# Patient Record
Sex: Female | Born: 1940 | Race: White | Hispanic: No | Marital: Married | Smoking: Former smoker
Health system: Southern US, Community
[De-identification: ages and names within clinical notes are randomized; demographics above are authoritative.]

## PROBLEM LIST (undated history)

## (undated) DIAGNOSIS — K2970 Gastritis, unspecified, without bleeding: Secondary | ICD-10-CM

## (undated) DIAGNOSIS — I10 Essential (primary) hypertension: Secondary | ICD-10-CM

## (undated) DIAGNOSIS — K2100 Gastro-esophageal reflux disease with esophagitis, without bleeding: Secondary | ICD-10-CM

## (undated) DIAGNOSIS — S92901A Unspecified fracture of right foot, initial encounter for closed fracture: Secondary | ICD-10-CM

## (undated) DIAGNOSIS — K219 Gastro-esophageal reflux disease without esophagitis: Secondary | ICD-10-CM

## (undated) DIAGNOSIS — G709 Myoneural disorder, unspecified: Secondary | ICD-10-CM

## (undated) DIAGNOSIS — G3184 Mild cognitive impairment, so stated: Secondary | ICD-10-CM

## (undated) DIAGNOSIS — G039 Meningitis, unspecified: Secondary | ICD-10-CM

## (undated) DIAGNOSIS — E785 Hyperlipidemia, unspecified: Secondary | ICD-10-CM

## (undated) DIAGNOSIS — K224 Dyskinesia of esophagus: Secondary | ICD-10-CM

## (undated) DIAGNOSIS — D1803 Hemangioma of intra-abdominal structures: Secondary | ICD-10-CM

## (undated) DIAGNOSIS — M1990 Unspecified osteoarthritis, unspecified site: Secondary | ICD-10-CM

## (undated) HISTORY — PX: ESOPHAGOGASTRODUODENOSCOPY: SHX1529

## (undated) HISTORY — PX: LUMBAR LAMINECTOMY: SHX95

## (undated) HISTORY — PX: COLONOSCOPY: SHX174

## (undated) HISTORY — PX: CATARACT EXTRACTION: SUR2

## (undated) HISTORY — PX: TUBAL LIGATION: SHX77

## (undated) HISTORY — PX: DENTAL SURGERY: SHX609

---

## 2003-04-21 ENCOUNTER — Inpatient Hospital Stay (HOSPITAL_COMMUNITY): Admission: RE | Admit: 2003-04-21 | Discharge: 2003-04-24 | Payer: Self-pay | Admitting: Neurological Surgery

## 2003-06-01 ENCOUNTER — Encounter: Admission: RE | Admit: 2003-06-01 | Discharge: 2003-06-01 | Payer: Self-pay | Admitting: Neurological Surgery

## 2003-06-10 ENCOUNTER — Encounter: Admission: RE | Admit: 2003-06-10 | Discharge: 2003-06-10 | Payer: Self-pay | Admitting: Neurological Surgery

## 2003-08-10 ENCOUNTER — Encounter: Admission: RE | Admit: 2003-08-10 | Discharge: 2003-08-10 | Payer: Self-pay | Admitting: Neurological Surgery

## 2003-09-23 ENCOUNTER — Encounter: Admission: RE | Admit: 2003-09-23 | Discharge: 2003-09-23 | Payer: Self-pay | Admitting: Neurological Surgery

## 2003-11-12 ENCOUNTER — Encounter: Admission: RE | Admit: 2003-11-12 | Discharge: 2003-11-12 | Payer: Self-pay | Admitting: Obstetrics

## 2004-04-24 ENCOUNTER — Encounter: Admission: RE | Admit: 2004-04-24 | Discharge: 2004-04-24 | Payer: Self-pay | Admitting: Neurological Surgery

## 2005-02-13 ENCOUNTER — Ambulatory Visit: Payer: Self-pay | Admitting: Internal Medicine

## 2005-05-03 ENCOUNTER — Ambulatory Visit (HOSPITAL_BASED_OUTPATIENT_CLINIC_OR_DEPARTMENT_OTHER): Admission: RE | Admit: 2005-05-03 | Discharge: 2005-05-03 | Payer: Self-pay | Admitting: Orthopedic Surgery

## 2005-07-06 ENCOUNTER — Ambulatory Visit: Payer: Self-pay | Admitting: Internal Medicine

## 2005-08-01 ENCOUNTER — Ambulatory Visit: Payer: Self-pay | Admitting: Gastroenterology

## 2005-08-06 ENCOUNTER — Ambulatory Visit: Payer: Self-pay | Admitting: Gastroenterology

## 2005-08-08 ENCOUNTER — Ambulatory Visit: Payer: Self-pay | Admitting: Gastroenterology

## 2005-12-11 ENCOUNTER — Ambulatory Visit: Payer: Self-pay | Admitting: Internal Medicine

## 2006-04-23 ENCOUNTER — Ambulatory Visit: Payer: Self-pay | Admitting: Internal Medicine

## 2006-09-10 ENCOUNTER — Other Ambulatory Visit: Payer: Self-pay

## 2006-09-10 ENCOUNTER — Ambulatory Visit: Payer: Self-pay | Admitting: Gastroenterology

## 2006-09-17 ENCOUNTER — Ambulatory Visit: Payer: Self-pay | Admitting: Gastroenterology

## 2007-11-06 IMAGING — US ABDOMEN ULTRASOUND
1 series · 17 of 25 positions shown · non-contrast
Comparison: none

REASON FOR EXAM: Nausea Vomiting RUQ
COMMENTS:

[Series 1: abdomen ultrasound · 17 of 62 slices shown]
[im 1/62]
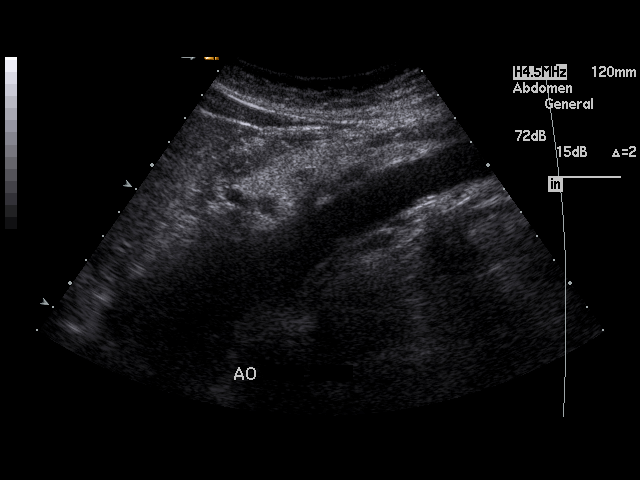
[im 6/62]
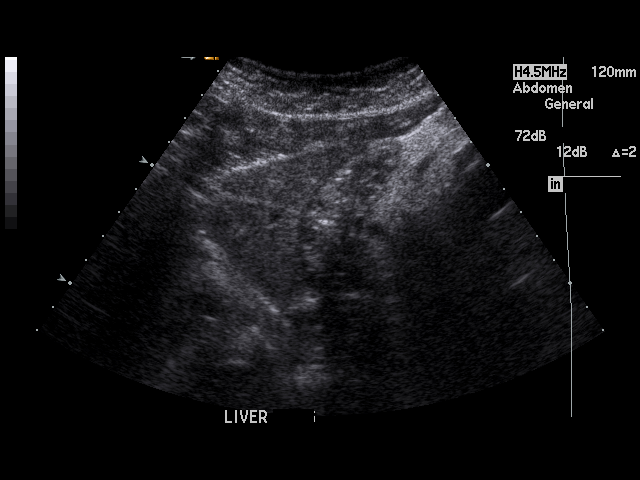
[im 8/62]
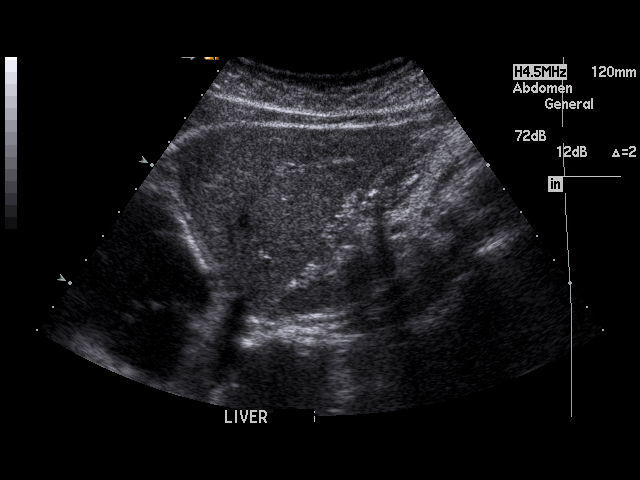
[im 13/62]
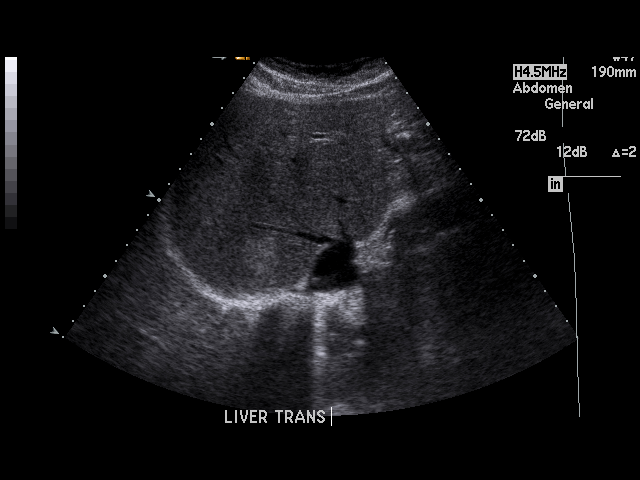
[im 16/62]
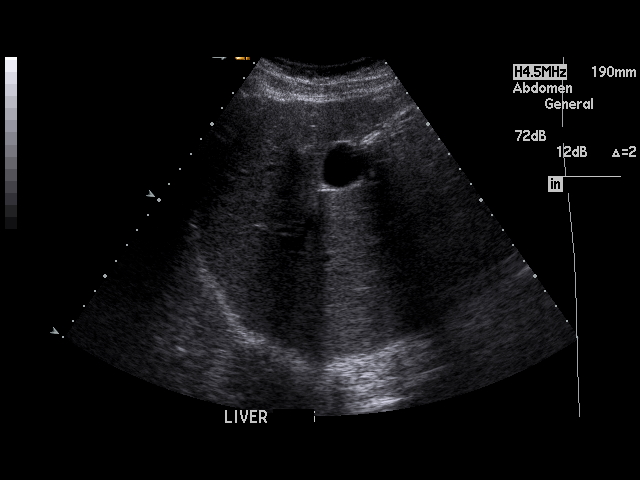
[im 21/62]
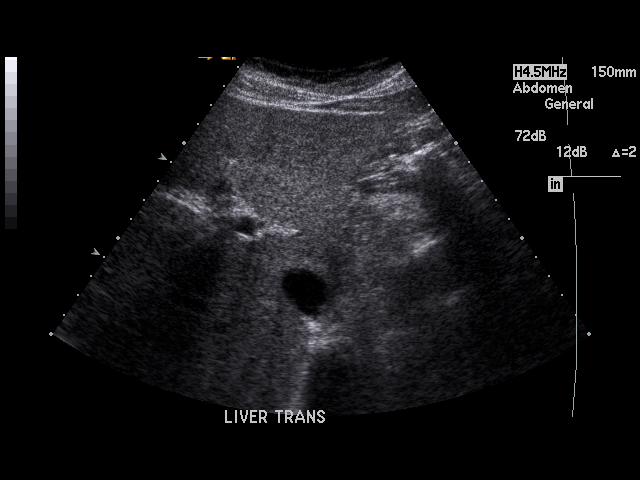
[im 23/62]
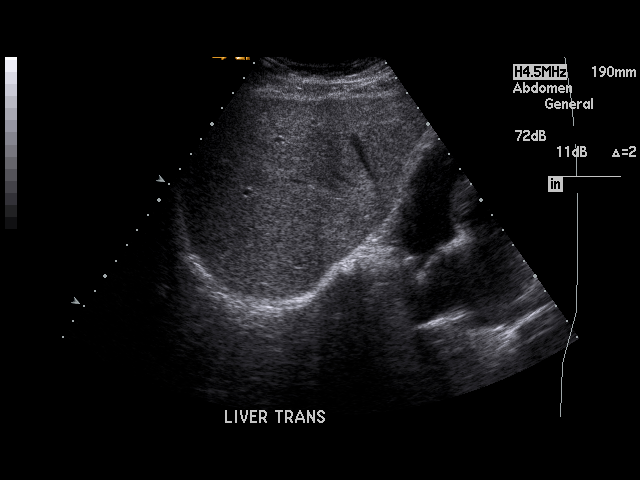
[im 28/62]
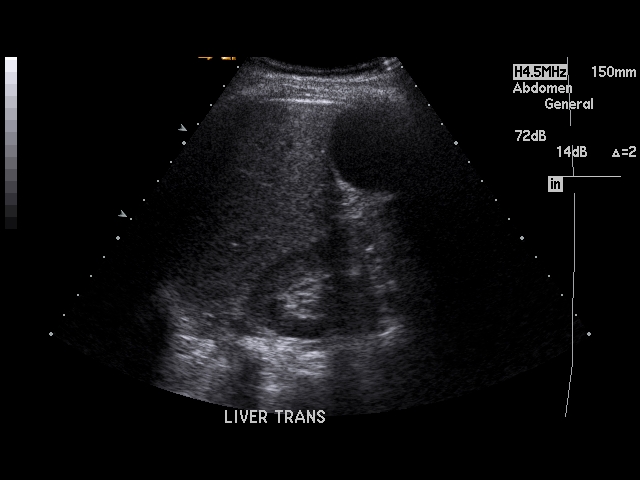
[im 31/62]
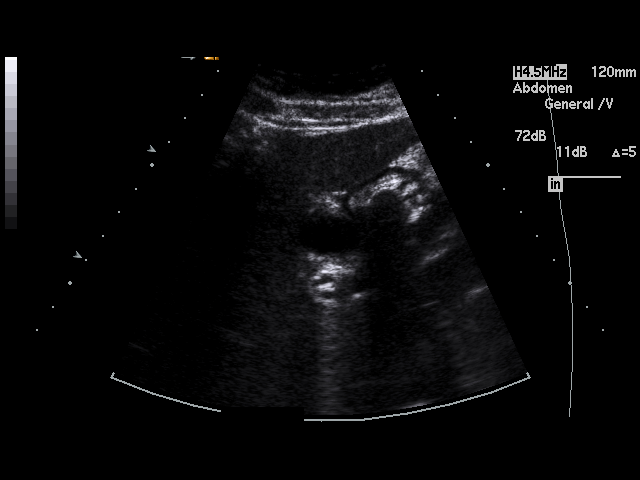
[im 34/62]
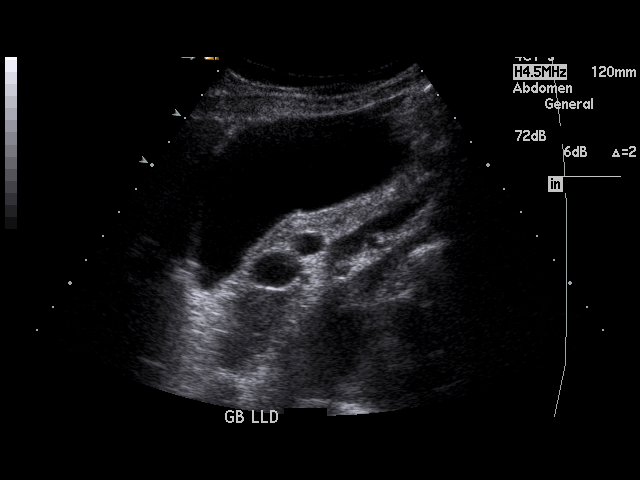
[im 39/62]
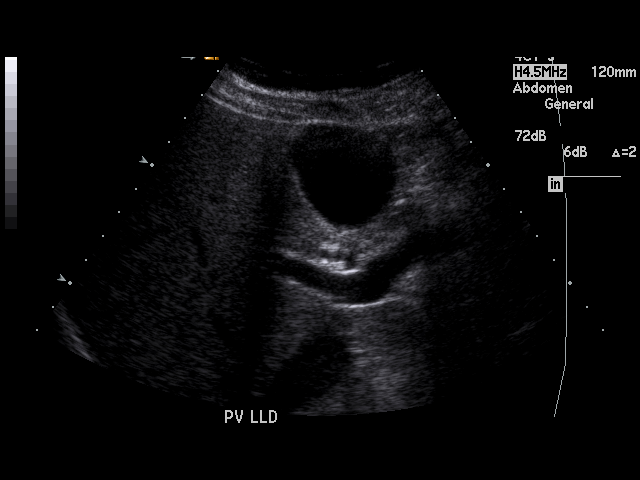
[im 41/62]
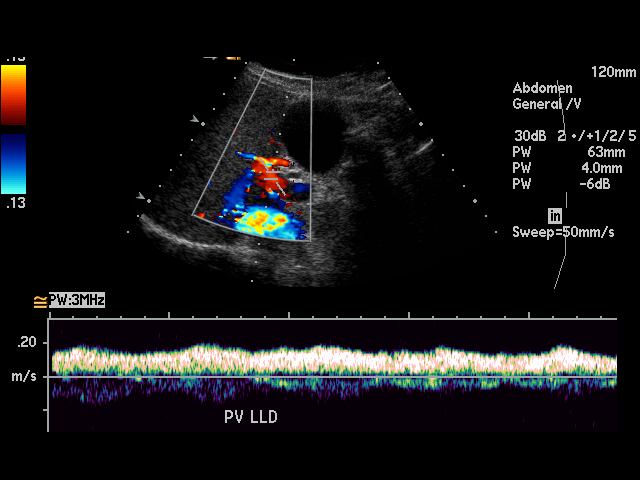
[im 46/62]
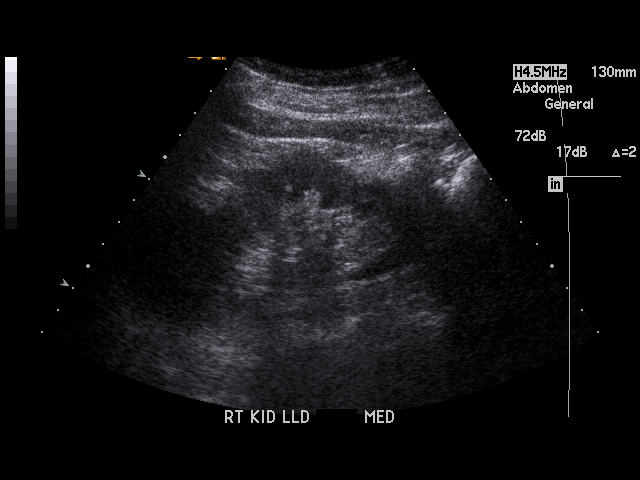
[im 49/62]
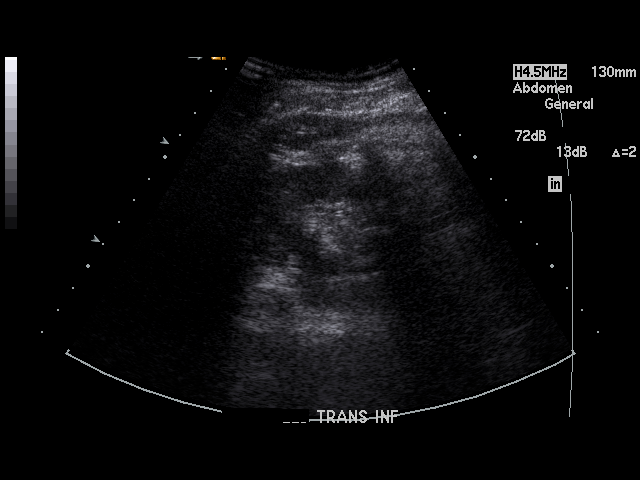
[im 54/62]
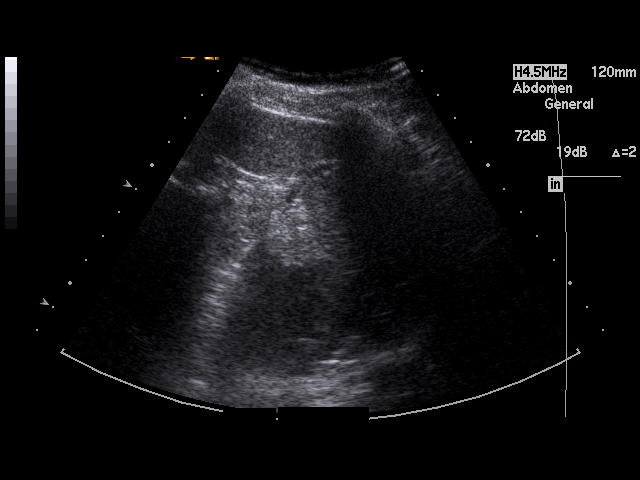
[im 56/62]
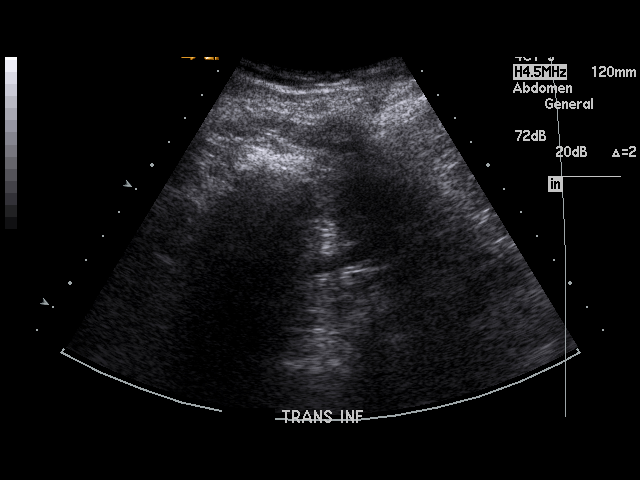
[im 62/62]
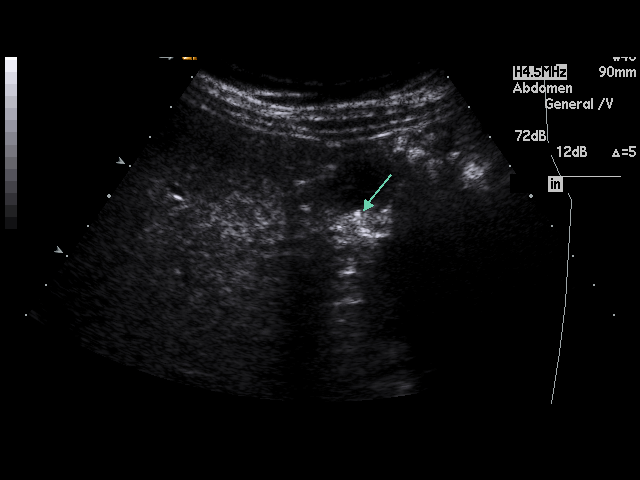

[17 of 25 positions shown; findings below may reference images not displayed]

PROCEDURE:     US  - US ABDOMEN GENERAL SURVEY  - July 06, 2005  [DATE]

RESULT:     There is a 1.9 cm hyperechoic focus posteriorly in the RIGHT
lobe of the liver.  In a patient with no known primary this likely
represents a hepatic hemangioma.  Note is made that a possible hepatic
hemangioma was mentioned in the report of a prior CT from 8111.  The hepatic
echo pattern otherwise is normal in appearance. Spleen size is normal. The
pancreas is normal in appearance. There is a tiny mobile echodensity in the
gallbladder suspicious for a tiny gallstone.  There is no thickening of the
gallbladder wall. The common bile duct measures 7.4 mm in diameter, which is
upper limits to normal.  If clinically indicated, biliary nuclide scan might
be helpful to further evaluate for patency of the common duct.  The kidneys
show no hydronephrosis. There is no ascites. The abdominal aorta is normal
in appearance.
IMPRESSION: 1)There is a tiny echodensity in the gallbladder compatible with a small
mobile gallstone.

2)No thickening of the gallbladder wall is seen.

3)The common bile duct measures 7.4 mm in diameter, which is upper limits to
normal.

4)There is a focal hyperechoic area in the RIGHT lobe of the liver
consistent with a hepatic hemangioma.  Since the prior CT is not available
to document that the possible hemangioma noted at CT corresponds in location
to the current finding, a follow-up ultrasound examination of the liver in
six months is recommended to document stability.

## 2007-12-11 ENCOUNTER — Ambulatory Visit: Payer: Self-pay | Admitting: Internal Medicine

## 2008-05-11 ENCOUNTER — Ambulatory Visit (HOSPITAL_COMMUNITY): Admission: RE | Admit: 2008-05-11 | Discharge: 2008-05-11 | Payer: Self-pay | Admitting: Anesthesiology

## 2008-09-27 ENCOUNTER — Ambulatory Visit: Payer: Self-pay | Admitting: Gastroenterology

## 2008-10-12 ENCOUNTER — Ambulatory Visit: Payer: Self-pay | Admitting: Gastroenterology

## 2008-12-28 ENCOUNTER — Ambulatory Visit: Payer: Self-pay | Admitting: Gastroenterology

## 2008-12-29 ENCOUNTER — Ambulatory Visit: Payer: Self-pay | Admitting: Gastroenterology

## 2009-01-24 ENCOUNTER — Ambulatory Visit: Payer: Self-pay | Admitting: Internal Medicine

## 2009-10-21 ENCOUNTER — Ambulatory Visit (HOSPITAL_BASED_OUTPATIENT_CLINIC_OR_DEPARTMENT_OTHER): Admission: RE | Admit: 2009-10-21 | Discharge: 2009-10-21 | Payer: Self-pay | Admitting: Orthopedic Surgery

## 2010-01-26 ENCOUNTER — Ambulatory Visit: Payer: Self-pay | Admitting: Internal Medicine

## 2010-08-25 NOTE — Discharge Summary (Signed)
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   PROCEDURE:  Posterior lumbar interbody fusion at L4-5.   HISTORY OF PRESENT ILLNESS:  The patient is a 70 year old white female who  had undergone a previous lumbar decompression and noninstrumented fusion by  Dr. Gerrit Heck several months ago.  She continued to have back pain and right  leg pain.  She had plain films which showed increasing spondylolisthesis at  L4-5.  She had MRI and a CT scan which showed a Pseudoarthrosis at L4-5 with  worsening spondylolisthesis.  I recommended lumbar exploration, repeat  decompression, and instrumented fusion at L4-5.  She understood the risks,  benefits, and alternatives and wished to proceed.   HOSPITAL COURSE:  The patient was admitted on April 21, 2003, where she  underwent a lumbar reexploration, repeat decompression, and instrumented  fusion at L4-5. The patient tolerated the procedure well, was taken to the  recovery room and then to the floor in stable condition.  For details of the  operative procedure, please see the dictated operative note.  The patient's  hospital course was routine, there were no complications.  She slowly  mobilized to the point that she was ambulating in the halls with minimal  difficulty.  She had appropriate incisional pain. She had a little bit of  right posterior thigh pain. She was tolerating a regular diet, she was  afebrile, and her vital signs remained stable.  Her CBC's looked good  without significant blood loss  anemia.  She was discharged home in stable  condition on April 24, 2003.   DISCHARGE MEDICATIONS:  Percocet and Flexeril.   FOLLOW UP:  In two weeks with Dr. Yetta Barre.   FINAL DIAGNOSES:  Pseudoarthrosis, status post instrumented posterior lumbar  interbody fusion at L4-5.                                                Tia Alert, MD    DSJ/MEDQ  D:  05/07/2003  T:  05/08/2003  Job:  [id number]

## 2010-08-25 NOTE — Op Note (Signed)
Erika Phillips, Erika Phillips                             ACCOUNT NO.:  [id number]   MEDICAL RECORD NO.:  [id number]                   PATIENT TYPE:  INP   LOCATION:  3015                                 FACILITY:  [facility]   PHYSICIAN:  Tia Alert, MD                  DATE OF BIRTH:  11/21/1940   DATE OF PROCEDURE:  04/21/2003  DATE OF DISCHARGE:                                 OPERATIVE REPORT   PREOPERATIVE DIAGNOSES:  1. 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  2. Grade 1 spondylolisthesis, L4-5 with central canal and lateral recess     stenosis.  3. Pseudoarthrosis L4-5.   POSTOPERATIVE DIAGNOSES:  1. 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  2. Grade 1 spondylolisthesis, L4-5 with central canal and lateral recess     stenosis.  3. Pseudoarthrosis L4-5.   OPERATION PERFORMED:  1. Exploration of fusion L4-5.  2. Repeat decompressive laminectomy, hemifacetectomy and foraminotomy L4-5     for nerve root and central canal decompression.  3. Posterior lumbar interbody fusion, L4-5 utilizing 12 mm tangent allograft     bone wedges, bone morphogenic protein and locally harvested morselized     autologous bone graft.  4. Intertransverse arthrodesis utilizing bone morphogenic protein and local     autograft.  5. Nonsegmental fixation, L4-5.   SURGEON:  Tia Alert, MD   ASSISTANT:  Donalee Citrin, M.D.   ANESTHESIA:  General endotracheal.   COMPLICATIONS:  None.   ESTIMATED BLOOD LOSS:  1200 mL.  700 mL given back as Cell Saver.   FINDINGS:  This patient had a pseudoarthrosis at L4-5 where she had  undergone a previous Gill decompression and noninstrumented fusion by  another Careers adviser several months ago.  We found significant scar tissue making  the decompression much more difficult to perform.  We also found that she  had a pseudoarthrosis with no significant bony fusion of the onlay autograft  that she had before and she had significant  segmental instability at L4-5.  Again, the significant scar tissue made it very difficult to decompress the  L4 and L5 nerve roots to try to treat her radicular pain.   INDICATIONS FOR PROCEDURE:  Erika Phillips is a 70 year old white female who had  undergone a previous Gill decompression and noninstrumented fusion by  another Careers adviser.  She had continued back pain with right leg pain.  She had  an MRI and then a CT myelogram which showed lateral recess stenosis with  glacial instability at L4-5 with progressive slip at L4-5 on serial x-rays.  She had tried medical management for quite some time without significant  relief.  I recommended exploration of her fusion with repeat decompression  and instrumented fusion at L4-5 to treat her pseudoarthrosis and her  spondylolisthesis.  She understood the risks, benefits and alternatives and  wished to proceed.   DESCRIPTION OF PROCEDURE:  The patient was taken to the operating room and  after induction of adequate general endotracheal anesthesia, she was rolled  in prone position on the Wilson frame and all pressure points were padded.  Her lumbar region was prepped with DuraPrep and draped in the usual sterile  fashion.  10 mL of local anesthesia was injected and then a dorsal midline  incision was made and carried down to the lumbosacral fascia which was  opened and the paraspinous musculature was taken down in subperiosteal  fashion to expose the L3 and L5 spinous processes.  The L4 spinous process  was gone.  We dissected down over the facets at L4-5.  Because of the  significant scarring and the previous attempted posterolateral fusion, it  was a very difficult dissection and took quite some time and took great care  to avoid damage of the nerve roots and to avoid dural laceration, we were  very careful to dissect the scar away from the bony edges.  We were then  able to remove the inferior facets of L4 and perform a hemifacetectomy and   complete the laminectomy and then perform wide laminotomies at L4-5  bilaterally.  We were able to identify the L4 and L5 nerve roots which were  encased in scar tissue.  We took great care to decompress these and to  detether these from the scar tissue.  Once the decompression was complete,  we turned our attention to the posterior lumbar interbody fusion.  We  coagulated the epidural venous vasculature and cut the disk space  bilaterally and performed initial diskectomy with pituitary rongeurs.  We  then distracted the disk space up to 12 mm with sequential distraction plugs  and then on the patient's right side we used the rotating cutter, the round  scraper and then the 12 mm cutting chisel to prepare the end plates.  We  then placed a 12 mm tangent allograft bone wedge into the interspace at L4-  5.  We then on the other side prepared the interspace the same way with the  rotating cutter, round scraper and cutting chisel.  We then packed the  midline with local autograft mixed with a BMP sponge for arthrodesis and  then placed another 12 mm tangent allograft bone wedge at the interspace on  the left side. Once the PLIF was complete, we turned our attention to the  nonsegmental fixation with localized pedicle screw entry zones and under  fluoroscopic guidance, placed pedicle screws into the pedicles of L4 and L5.  We probed each pedicle, tapped each pedicle with a 5.5 tap, then palpated  each pedicle to assure no break in the cortex and then placed 6.5 x 40 mm  pedicle screws into the pedicles of L4 and L5 bilaterally.  We then  decorticated the facets and transverse processes and placed a combination of  local autograft with BMP out over these to perform a lateral arthrodesis  also.  We then placed two 40 mm rods into the multi_________  screw heads  and locked these into position with locking caps with an antitorque device. We did use the compression tools to compress these prior to  final locking.  We then placed a separate cross link.  We then lined the dura with Gelfoam  and then dried all bleeding points.  We then placed a medium Hemovac drain.  We then closed the muscle and then the fascia with interrupted #  1 Vicryl.  We closed the subcutaneous tissue with 2-0 and 3-0 Vicryl and closed the  skin with benzoin and Steri-Strips.  Drapes were removed and sterile  dressing was applied.  The patient was awakened from general anesthesia and  transported  to the recovery room in stable condition.  At the end of the  procedure all sponge, needle and instrument counts were correct.                                               Tia Alert, MD    DSJ/MEDQ  D:  04/21/2003  T:  04/22/2003  Job:  [id number]

## 2010-08-25 NOTE — Op Note (Signed)
NAMENARY, SNEED                 ACCOUNT NO.:  [id number]   MEDICAL RECORD NO.:  [id number]          PATIENT TYPE:  AMB   LOCATION:  DSC                          FACILITY:  [facility]   PHYSICIAN:  Katy Fitch. Sypher, M.D. 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   OPERATION:  1.  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  2.  Release of left long finger A1 pulley.   SURGEON:  Katy Fitch. Sypher, M.D.   ASSISTANT:  Marveen Reeks. Dasnoit, P.A.-C.   ANESTHESIA:  0.25% Marcaine and 2% lidocaine metacarpal head level block,  left long finger supplemented by IV sedation.   SUPERVISING ANESTHESIOLOGIST:  Janetta Hora. Gelene Mink, M.D.   INDICATIONS:  Erika Phillips is a 70 year old woman who is employed at the  [employer].   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   In addition she was developing early Dupuytren's disease in the pre  tendinous fibers to a long finger, causing a nodule and cord in her palm.   She is brought to the operating at this time for release of her left long  finger A1 pulley. We will need to perform incidental Dupuytren's release to  expose the pulley for safe release.   DESCRIPTION OF PROCEDURE:  Vesna Kable was brought to the operating room and  placed supine  position on the table.   Following light sedation the left arm was prepped with Betadine soap and  solution and sterilely draped.  A pneumatic tourniquet was applied to the  proximal left brachium.   0.25% Marcaine and 2% lidocaine were infiltrated into the path of the  intended incision and around the common digital nerves serving the long  finger.   When anesthesia was satisfactory, the arm was exsanguinated by elevation and  compression and an arterial tourniquet on the proximal brachium inflated to  220 mmHg.   The procedure commenced with a oblique incision in the line of the distal  palmar crease. The subcutaneous tissues were divided taking care to separate  the Dupuytren's nodules from the deep surface of the dermis over a distance  of 2.5 cm.   These were circumferentially dissected and removed.   This released the tension in the pretendinous fibers to the long finger.   The A1 pulley was then isolated.  Blunt retractors placed to protect the  neurovascular structures followed by release of the radial aspect of the  A1  pulley.   Thereafter, free range of motion of the long finger was recovered with full  flexion and extension without residual crepitation.   The wound was then checked for bleeding points, repaired with mattress  sutures of 5-0 nylon.   A compressive dressing was applied with Xeroflo, sterile gauze and Ace wrap.   For aftercare, Ms. Rozzell is provided a prescription for Vicodin 5 mg one  p.o. q.4-6h. p.r.n. pain, #20 tablets without refill.   She will return to our office for followup in a week for suture removal and  advancement to a therapy program.      Katy Fitch. Sypher, M.D.  Electronically Signed     RVS/MEDQ  D:  05/03/2005  T:  05/04/2005  Job:  [id number]   cc:   Loraine Leriche L. Vear Clock, M.D.  Fax: [phone]

## 2011-02-08 ENCOUNTER — Ambulatory Visit: Payer: Self-pay | Admitting: Internal Medicine

## 2011-03-16 ENCOUNTER — Ambulatory Visit: Payer: Self-pay | Admitting: Gastroenterology

## 2011-07-24 ENCOUNTER — Ambulatory Visit: Payer: Self-pay | Admitting: Gastroenterology

## 2012-02-12 ENCOUNTER — Ambulatory Visit: Payer: Self-pay | Admitting: Internal Medicine

## 2013-03-30 ENCOUNTER — Ambulatory Visit: Payer: Self-pay | Admitting: Internal Medicine

## 2013-09-23 ENCOUNTER — Ambulatory Visit: Payer: Self-pay | Admitting: Ophthalmology

## 2013-10-28 ENCOUNTER — Ambulatory Visit: Payer: Self-pay | Admitting: Ophthalmology

## 2013-11-09 ENCOUNTER — Ambulatory Visit: Payer: Self-pay | Admitting: Gastroenterology

## 2013-11-17 ENCOUNTER — Ambulatory Visit: Payer: Self-pay | Admitting: Gastroenterology

## 2013-11-19 LAB — PATHOLOGY REPORT

## 2014-04-06 ENCOUNTER — Ambulatory Visit: Payer: Self-pay | Admitting: Internal Medicine

## 2015-03-02 ENCOUNTER — Other Ambulatory Visit: Payer: Self-pay | Admitting: Anesthesiology

## 2015-03-02 DIAGNOSIS — M545 Low back pain: Secondary | ICD-10-CM

## 2015-03-22 ENCOUNTER — Ambulatory Visit
Admission: RE | Admit: 2015-03-22 | Discharge: 2015-03-22 | Disposition: A | Discharge status: Home / Self Care | Payer: Medicare Other | Source: Ambulatory Visit | Attending: Anesthesiology | Admitting: Anesthesiology

## 2015-03-22 DIAGNOSIS — M545 Low back pain: Secondary | ICD-10-CM

## 2015-03-22 MED ORDER — GADOBENATE DIMEGLUMINE 529 MG/ML IV SOLN: 12.0000 mL | Freq: Once | INTRAVENOUS | Status: DC | PRN

## 2015-05-18 ENCOUNTER — Other Ambulatory Visit: Payer: Self-pay | Admitting: Internal Medicine

## 2015-05-18 DIAGNOSIS — Z1231 Encounter for screening mammogram for malignant neoplasm of breast: Secondary | ICD-10-CM

## 2015-05-26 ENCOUNTER — Ambulatory Visit: Payer: Medicare Other

## 2015-05-26 ENCOUNTER — Ambulatory Visit
Admission: RE | Admit: 2015-05-26 | Discharge: 2015-05-26 | Disposition: A | Discharge status: Home / Self Care | Payer: Medicare Other | Source: Ambulatory Visit | Attending: Internal Medicine | Admitting: Internal Medicine

## 2015-05-26 DIAGNOSIS — Z1231 Encounter for screening mammogram for malignant neoplasm of breast: Secondary | ICD-10-CM | POA: Insufficient documentation

## 2015-05-30 ENCOUNTER — Ambulatory Visit
Admission: RE | Admit: 2015-05-30 | Discharge: 2015-05-30 | Disposition: A | Discharge status: Home / Self Care | Payer: Medicare Other | Source: Ambulatory Visit | Attending: Anesthesiology | Admitting: Anesthesiology

## 2015-05-30 ENCOUNTER — Other Ambulatory Visit: Payer: Self-pay | Admitting: Anesthesiology

## 2015-05-30 DIAGNOSIS — Z9689 Presence of other specified functional implants: Secondary | ICD-10-CM

## 2015-05-30 DIAGNOSIS — Z9889 Other specified postprocedural states: Principal | ICD-10-CM

## 2015-08-16 ENCOUNTER — Other Ambulatory Visit: Payer: Self-pay | Admitting: Neurosurgery

## 2015-08-31 ENCOUNTER — Encounter (HOSPITAL_COMMUNITY)
Admission: RE | Admit: 2015-08-31 | Discharge: 2015-08-31 | Disposition: A | Discharge status: Home / Self Care | Payer: Medicare Other | Source: Ambulatory Visit | Attending: Neurosurgery | Admitting: Neurosurgery

## 2015-08-31 ENCOUNTER — Other Ambulatory Visit: Payer: Self-pay

## 2015-08-31 ENCOUNTER — Encounter (HOSPITAL_COMMUNITY): Payer: Self-pay

## 2015-08-31 DIAGNOSIS — M961 Postlaminectomy syndrome, not elsewhere classified: Secondary | ICD-10-CM | POA: Insufficient documentation

## 2015-08-31 DIAGNOSIS — Z01818 Encounter for other preprocedural examination: Secondary | ICD-10-CM | POA: Diagnosis present

## 2015-08-31 DIAGNOSIS — Z01812 Encounter for preprocedural laboratory examination: Secondary | ICD-10-CM | POA: Insufficient documentation

## 2015-08-31 HISTORY — DX: Gastro-esophageal reflux disease with esophagitis, without bleeding: K21.00

## 2015-08-31 HISTORY — DX: Hemangioma of intra-abdominal structures: D18.03

## 2015-08-31 HISTORY — DX: Unspecified fracture of right foot, initial encounter for closed fracture: S92.901A

## 2015-08-31 HISTORY — DX: Unspecified osteoarthritis, unspecified site: M19.90

## 2015-08-31 HISTORY — DX: Gastritis, unspecified, without bleeding: K29.70

## 2015-08-31 HISTORY — DX: Essential (primary) hypertension: I10

## 2015-08-31 HISTORY — DX: Gastro-esophageal reflux disease with esophagitis: K21.0

## 2015-08-31 HISTORY — DX: Myoneural disorder, unspecified: G70.9

## 2015-08-31 HISTORY — DX: Gastro-esophageal reflux disease without esophagitis: K21.9

## 2015-08-31 HISTORY — DX: Meningitis, unspecified: G03.9

## 2015-08-31 HISTORY — DX: Hyperlipidemia, unspecified: E78.5

## 2015-08-31 LAB — SURGICAL PCR SCREEN
MRSA, PCR: NEGATIVE
Staphylococcus aureus: NEGATIVE

## 2015-08-31 LAB — BASIC METABOLIC PANEL
ANION GAP: 8 (ref 5–15)
BUN: 16 mg/dL (ref 6–20)
CALCIUM: 9.3 mg/dL (ref 8.9–10.3)
CO2: 27 mmol/L (ref 22–32)
CREATININE: 1.01 mg/dL — AB (ref 0.44–1.00)
Chloride: 100 mmol/L — ABNORMAL LOW (ref 101–111)
GFR calc Af Amer: >60 mL/min (ref >60)
GFR calc non Af Amer: 54 mL/min — ABNORMAL LOW (ref >60)
GLUCOSE: 93 mg/dL (ref 65–99)
Potassium: 3.8 mmol/L (ref 3.5–5.1)
Sodium: 135 mmol/L (ref 135–145)

## 2015-08-31 LAB — CBC
HEMATOCRIT: 36.8 % (ref 36.0–46.0)
Hemoglobin: 11.7 g/dL — ABNORMAL LOW (ref 12.0–15.0)
MCH: 29.3 pg (ref 26.0–34.0)
MCHC: 31.8 g/dL (ref 30.0–36.0)
MCV: 92 fL (ref 78.0–100.0)
PLATELETS: 199 10*3/uL (ref 150–400)
RBC: 4 MIL/uL (ref 3.87–5.11)
RDW: 13 % (ref 11.5–15.5)
WBC: 5.9 10*3/uL (ref 4.0–10.5)

## 2015-08-31 NOTE — Progress Notes (Signed)
PCP - Ramonita Lab, MD Cardiologist - denies  Chest x-ray - denies EKG - 08/31/15 Stress Test - denies ECHO - denies Cardiac Cath - denies    Patient denies shortness of breath and chest pain at PAT appointment

## 2015-08-31 NOTE — Pre-Procedure Instructions (Signed)
Vivie A Stocks  08/31/2015     No Pharmacies Listed   Your procedure is scheduled on Friday, June 2nd, 2017.  Report to [facility] Admitting at 10:30 A.M.   Call this number if you have problems the morning of surgery:  [phone]   Remember:  Do not eat food or drink liquids after midnight.   Take these medicines the morning of surgery with A SIP OF WATER: Dexilant, Hydrocodone-acetaminophen (Norco) if needed, Methadone (Dolophine) if needed, Sucralfate (Carafate).   7 days prior to surgery STOP taking any Aspirin, Aleve, Naproxen, Ibuprofen, Motrin, Advil, Goody's, BC's, all herbal medications, fish oil, and all vitamins    Do not wear jewelry, make-up or nail polish.  Do not wear lotions, powders, or perfumes.  You may NOT wear deodorant.  Do not shave 48 hours prior to surgery.    Do not bring valuables to the hospital.   [facility] is not responsible for any belongings or valuables.  Contacts, dentures or bridgework may not be worn into surgery.  Leave your suitcase in the car.  After surgery it may be brought to your room.  For patients admitted to the hospital, discharge time will be determined by your treatment team.  Patients discharged the day of surgery will not be allowed to drive home.   Special instructions:  See attached.   Please read over the following fact sheets that you were given. Pain Booklet, Coughing and Deep Breathing, MRSA Information and Surgical Site Infection Prevention     [place]- Preparing For Surgery  Before surgery, you can play an important role. Because skin is not sterile, your skin needs to be as free of germs as possible. You can reduce the number of germs on your skin by washing with CHG (chlorahexidine gluconate) Soap before surgery.  CHG is an antiseptic cleaner which kills germs and bonds with the skin to continue killing germs even after washing.  Please do not use if you have an allergy to CHG or  antibacterial soaps. If your skin becomes reddened/irritated stop using the CHG.  Do not shave (including legs and underarms) for at least 48 hours prior to first CHG shower. It is OK to shave your face.  Please follow these instructions carefully.   1. Shower the night before surgery and the morning of surgery with CHG.   2. If you chose to wash your hair, wash your hair first as usual with your normal shampoo.  3. After you shampoo, rinse your hair and body thoroughly to remove the shampoo.  4. Use CHG as you would any other liquid soap. You can apply CHG directly to the skin and wash gently with a scrungie or a clean washcloth.   5. Apply the CHG Soap to your body ONLY FROM THE NECK DOWN.  Do not use on open wounds or open sores. Avoid contact with your eyes, ears, mouth and genitals (private parts). Wash genitals (private parts) with your normal soap.  6. Wash thoroughly, paying special attention to the area where your surgery will be performed.  7. Thoroughly rinse your body with warm water from the neck down.  8. DO NOT shower/wash with your normal soap after using and rinsing off the CHG Soap.  9. Pat yourself dry with a clean towel.   10. Wear clean pajamas   11. Place clean sheets on your bed the night of your first shower and do not sleep with pets.  Day of Surgery: Do not apply any deodorants/lotions. Please wear clean clothes to the hospital/surgery center.

## 2015-09-08 MED ORDER — CEFAZOLIN SODIUM-DEXTROSE 2-4 GM/100ML-% IV SOLN
2.0000 g | INTRAVENOUS | Status: AC
  Administered 2015-09-09: 2 g via INTRAVENOUS
  Filled 2015-09-08: qty 100

## 2015-09-08 NOTE — Progress Notes (Signed)
I spoke with Erika Phillips and confirned arrival time of 66.

## 2015-09-09 ENCOUNTER — Ambulatory Visit (HOSPITAL_COMMUNITY): Payer: Medicare Other

## 2015-09-09 ENCOUNTER — Inpatient Hospital Stay (HOSPITAL_COMMUNITY)
Admission: RE | Admit: 2015-09-09 | Discharge: 2015-09-09 | DRG: 520 | Disposition: A | Discharge status: Home / Self Care | Payer: Medicare Other | Source: Ambulatory Visit | Attending: Neurosurgery | Admitting: Neurosurgery

## 2015-09-09 ENCOUNTER — Ambulatory Visit (HOSPITAL_COMMUNITY): Payer: Medicare Other | Admitting: Anesthesiology

## 2015-09-09 ENCOUNTER — Encounter (HOSPITAL_COMMUNITY)
Admission: RE | Disposition: A | Discharge status: Home / Self Care | Payer: Self-pay | Source: Ambulatory Visit | Attending: Neurosurgery

## 2015-09-09 ENCOUNTER — Encounter (HOSPITAL_COMMUNITY): Payer: Self-pay | Admitting: *Deleted

## 2015-09-09 DIAGNOSIS — Y838 Other surgical procedures as the cause of abnormal reaction of the patient, or of later complication, without mention of misadventure at the time of the procedure: Secondary | ICD-10-CM | POA: Diagnosis present

## 2015-09-09 DIAGNOSIS — M961 Postlaminectomy syndrome, not elsewhere classified: Principal | ICD-10-CM | POA: Diagnosis present

## 2015-09-09 DIAGNOSIS — Z87891 Personal history of nicotine dependence: Secondary | ICD-10-CM

## 2015-09-09 DIAGNOSIS — I10 Essential (primary) hypertension: Secondary | ICD-10-CM | POA: Diagnosis present

## 2015-09-09 DIAGNOSIS — G8929 Other chronic pain: Secondary | ICD-10-CM | POA: Diagnosis present

## 2015-09-09 HISTORY — PX: SPINAL CORD STIMULATOR INSERTION: SHX5378

## 2015-09-09 SURGERY — INSERTION, SPINAL CORD STIMULATOR, LUMBAR
Anesthesia: General

## 2015-09-09 MED ORDER — 0.9 % SODIUM CHLORIDE (POUR BTL) OPTIME
TOPICAL | Status: DC | PRN
  Administered 2015-09-09: 1000 mL

## 2015-09-09 MED ORDER — SENNOSIDES-DOCUSATE SODIUM 8.6-50 MG PO TABS: 1.0000 | ORAL_TABLET | Freq: Every evening | ORAL | Status: DC | PRN

## 2015-09-09 MED ORDER — FLEET ENEMA 7-19 GM/118ML RE ENEM: 1.0000 | ENEMA | Freq: Once | RECTAL | Status: DC | PRN

## 2015-09-09 MED ORDER — ONDANSETRON HCL 4 MG/2ML IJ SOLN: 4.0000 mg | INTRAMUSCULAR | Status: DC | PRN

## 2015-09-09 MED ORDER — HEMOSTATIC AGENTS (NO CHARGE) OPTIME
TOPICAL | Status: DC | PRN
  Administered 2015-09-09: 1 via TOPICAL

## 2015-09-09 MED ORDER — HYDROCODONE-ACETAMINOPHEN 10-325 MG PO TABS: 1.0000 | ORAL_TABLET | ORAL | Status: DC | PRN

## 2015-09-09 MED ORDER — ROCURONIUM BROMIDE 100 MG/10ML IV SOLN
INTRAVENOUS | Status: DC | PRN
  Administered 2015-09-09: 30 mg via INTRAVENOUS

## 2015-09-09 MED ORDER — CEFAZOLIN SODIUM-DEXTROSE 2-4 GM/100ML-% IV SOLN: 2.0000 g | Freq: Three times a day (TID) | INTRAVENOUS | Status: DC

## 2015-09-09 MED ORDER — PHENYLEPHRINE HCL 10 MG/ML IJ SOLN
INTRAMUSCULAR | Status: DC | PRN
  Administered 2015-09-09: 120 ug via INTRAVENOUS
  Administered 2015-09-09: 80 ug via INTRAVENOUS

## 2015-09-09 MED ORDER — DOCUSATE SODIUM 100 MG PO CAPS: 100.0000 mg | ORAL_CAPSULE | Freq: Two times a day (BID) | ORAL | Status: DC

## 2015-09-09 MED ORDER — THROMBIN 5000 UNITS EX SOLR
CUTANEOUS | Status: DC | PRN
  Administered 2015-09-09 (×2): 5000 [IU] via TOPICAL

## 2015-09-09 MED ORDER — FLAVOCOXID-CIT ZN BISGLCINATE 500-50 MG PO CAPS: 1.0000 | ORAL_CAPSULE | Freq: Two times a day (BID) | ORAL | Status: DC

## 2015-09-09 MED ORDER — MIDAZOLAM HCL 2 MG/2ML IJ SOLN
INTRAMUSCULAR | Status: AC
  Filled 2015-09-09: qty 2

## 2015-09-09 MED ORDER — LIDOCAINE 2% (20 MG/ML) 5 ML SYRINGE
INTRAMUSCULAR | Status: DC | PRN
  Administered 2015-09-09: 100 mg via INTRAVENOUS

## 2015-09-09 MED ORDER — ACETAMINOPHEN 650 MG RE SUPP: 650.0000 mg | RECTAL | Status: DC | PRN

## 2015-09-09 MED ORDER — METHOCARBAMOL 1000 MG/10ML IJ SOLN
500.0000 mg | Freq: Four times a day (QID) | INTRAVENOUS | Status: DC | PRN
  Filled 2015-09-09: qty 5

## 2015-09-09 MED ORDER — PANTOPRAZOLE SODIUM 40 MG PO TBEC: 40.0000 mg | DELAYED_RELEASE_TABLET | Freq: Every day | ORAL | Status: DC

## 2015-09-09 MED ORDER — ZOLPIDEM TARTRATE 5 MG PO TABS: 5.0000 mg | ORAL_TABLET | Freq: Every evening | ORAL | Status: DC | PRN

## 2015-09-09 MED ORDER — MIDAZOLAM HCL 5 MG/5ML IJ SOLN
INTRAMUSCULAR | Status: DC | PRN
  Administered 2015-09-09: 1 mg via INTRAVENOUS

## 2015-09-09 MED ORDER — SODIUM CHLORIDE 0.9% FLUSH: 3.0000 mL | INTRAVENOUS | Status: DC | PRN

## 2015-09-09 MED ORDER — FAMOTIDINE IN NACL 20-0.9 MG/50ML-% IV SOLN
20.0000 mg | Freq: Two times a day (BID) | INTRAVENOUS | Status: DC
  Filled 2015-09-09: qty 50

## 2015-09-09 MED ORDER — PROPOFOL 10 MG/ML IV BOLUS
INTRAVENOUS | Status: DC | PRN
  Administered 2015-09-09: 85 mg via INTRAVENOUS

## 2015-09-09 MED ORDER — HYDROCHLOROTHIAZIDE 25 MG PO TABS: 25.0000 mg | ORAL_TABLET | Freq: Every day | ORAL | Status: DC

## 2015-09-09 MED ORDER — HYDROCODONE-ACETAMINOPHEN 5-325 MG PO TABS: 1.0000 | ORAL_TABLET | ORAL | Status: DC | PRN

## 2015-09-09 MED ORDER — AZELASTINE HCL 0.1 % NA SOLN
1.0000 | Freq: Every day | NASAL | Status: DC | PRN
  Filled 2015-09-09: qty 30

## 2015-09-09 MED ORDER — SODIUM CHLORIDE 0.9% FLUSH: 3.0000 mL | Freq: Two times a day (BID) | INTRAVENOUS | Status: DC

## 2015-09-09 MED ORDER — BISACODYL 10 MG RE SUPP: 10.0000 mg | Freq: Every day | RECTAL | Status: DC | PRN

## 2015-09-09 MED ORDER — FENTANYL CITRATE (PF) 100 MCG/2ML IJ SOLN
INTRAMUSCULAR | Status: AC
  Filled 2015-09-09: qty 2

## 2015-09-09 MED ORDER — FENTANYL CITRATE (PF) 100 MCG/2ML IJ SOLN
25.0000 ug | INTRAMUSCULAR | Status: DC | PRN
  Administered 2015-09-09 (×2): 25 ug via INTRAVENOUS

## 2015-09-09 MED ORDER — PHENOL 1.4 % MT LIQD
1.0000 | OROMUCOSAL | Status: DC | PRN
Start: 2015-09-09 — End: 2015-09-09

## 2015-09-09 MED ORDER — LACTATED RINGERS IV SOLN
INTRAVENOUS | Status: DC
  Administered 2015-09-09 (×2): via INTRAVENOUS

## 2015-09-09 MED ORDER — HYDROMORPHONE HCL 1 MG/ML IJ SOLN: 0.5000 mg | INTRAMUSCULAR | Status: DC | PRN

## 2015-09-09 MED ORDER — SUCRALFATE 1 G PO TABS: 1.0000 g | ORAL_TABLET | Freq: Every day | ORAL | Status: DC

## 2015-09-09 MED ORDER — PROPOFOL 10 MG/ML IV BOLUS
INTRAVENOUS | Status: AC
  Filled 2015-09-09: qty 20

## 2015-09-09 MED ORDER — HYPROMELLOSE (GONIOSCOPIC) 2.5 % OP SOLN
1.0000 [drp] | OPHTHALMIC | Status: DC | PRN
Start: 2015-09-09 — End: 2015-09-09
  Filled 2015-09-09: qty 15

## 2015-09-09 MED ORDER — LISINOPRIL 20 MG PO TABS: 10.0000 mg | ORAL_TABLET | Freq: Every day | ORAL | Status: DC

## 2015-09-09 MED ORDER — ACETAMINOPHEN 325 MG PO TABS: 650.0000 mg | ORAL_TABLET | ORAL | Status: DC | PRN

## 2015-09-09 MED ORDER — BUPIVACAINE HCL (PF) 0.5 % IJ SOLN
INTRAMUSCULAR | Status: DC | PRN
  Administered 2015-09-09: 9.5 mL

## 2015-09-09 MED ORDER — PHENYLEPHRINE HCL 10 MG/ML IJ SOLN
10.0000 mg | INTRAVENOUS | Status: DC | PRN
  Administered 2015-09-09: 40 ug/min via INTRAVENOUS

## 2015-09-09 MED ORDER — ROCURONIUM BROMIDE 50 MG/5ML IV SOLN
INTRAVENOUS | Status: AC
  Filled 2015-09-09: qty 1

## 2015-09-09 MED ORDER — DOXEPIN HCL 10 MG PO CAPS
10.0000 mg | ORAL_CAPSULE | Freq: Every evening | ORAL | Status: DC | PRN
  Filled 2015-09-09: qty 1

## 2015-09-09 MED ORDER — LIDOCAINE-EPINEPHRINE 1 %-1:100000 IJ SOLN
INTRAMUSCULAR | Status: DC | PRN
  Administered 2015-09-09: 9.5 mL

## 2015-09-09 MED ORDER — KCL IN DEXTROSE-NACL 20-5-0.45 MEQ/L-%-% IV SOLN: INTRAVENOUS | Status: DC

## 2015-09-09 MED ORDER — ALUM & MAG HYDROXIDE-SIMETH 200-200-20 MG/5ML PO SUSP: 30.0000 mL | Freq: Four times a day (QID) | ORAL | Status: DC | PRN

## 2015-09-09 MED ORDER — SUFENTANIL CITRATE 50 MCG/ML IV SOLN
INTRAVENOUS | Status: DC | PRN
  Administered 2015-09-09 (×2): 10 ug via INTRAVENOUS

## 2015-09-09 MED ORDER — SUFENTANIL CITRATE 50 MCG/ML IV SOLN
INTRAVENOUS | Status: AC
  Filled 2015-09-09: qty 1

## 2015-09-09 MED ORDER — MENTHOL 3 MG MT LOZG: 1.0000 | LOZENGE | OROMUCOSAL | Status: DC | PRN

## 2015-09-09 MED ORDER — METHOCARBAMOL 500 MG PO TABS: 500.0000 mg | ORAL_TABLET | Freq: Four times a day (QID) | ORAL | Status: DC | PRN

## 2015-09-09 MED ORDER — METHADONE HCL 10 MG PO TABS: 10.0000 mg | ORAL_TABLET | Freq: Three times a day (TID) | ORAL | Status: DC

## 2015-09-09 MED ORDER — OXYCODONE-ACETAMINOPHEN 5-325 MG PO TABS: 1.0000 | ORAL_TABLET | ORAL | Status: DC | PRN

## 2015-09-09 MED ORDER — LIDOCAINE 2% (20 MG/ML) 5 ML SYRINGE
INTRAMUSCULAR | Status: AC
  Filled 2015-09-09: qty 5

## 2015-09-09 SURGICAL SUPPLY — 66 items
ADH SKN CLS APL DERMABOND .7 (GAUZE/BANDAGES/DRESSINGS) ×1
APL SKNCLS STERI-STRIP NONHPOA (GAUZE/BANDAGES/DRESSINGS)
BENZOIN TINCTURE PRP APPL 2/3 (GAUZE/BANDAGES/DRESSINGS) IMPLANT
BLADE CLIPPER SURG (BLADE) IMPLANT
BRUSH SCRUB EZ PLAIN DRY (MISCELLANEOUS) ×2 IMPLANT
BUR MATCHSTICK NEURO 3.0 LAGG (BURR) IMPLANT
BUR PRECISION FLUTE 5.0 (BURR) ×2 IMPLANT
DECANTER SPIKE VIAL GLASS SM (MISCELLANEOUS) ×2 IMPLANT
DERMABOND ADVANCED (GAUZE/BANDAGES/DRESSINGS) ×1
DERMABOND ADVANCED .7 DNX12 (GAUZE/BANDAGES/DRESSINGS) IMPLANT
DRAPE C-ARM 42X72 X-RAY (DRAPES) ×2 IMPLANT
DRAPE LAPAROTOMY 100X72X124 (DRAPES) ×2 IMPLANT
DRAPE POUCH INSTRU U-SHP 10X18 (DRAPES) ×2 IMPLANT
DRAPE SURG 17X23 STRL (DRAPES) ×2 IMPLANT
DRSG OPSITE POSTOP 3X4 (GAUZE/BANDAGES/DRESSINGS) ×1 IMPLANT
DRSG OPSITE POSTOP 4X6 (GAUZE/BANDAGES/DRESSINGS) ×1 IMPLANT
ELECT REM PT RETURN 9FT ADLT (ELECTROSURGICAL) ×2
ELECTRODE REM PT RTRN 9FT ADLT (ELECTROSURGICAL) ×1 IMPLANT
ELEVATER PASSER (SPINAL CORD STIMULATOR) ×2
GAUZE SPONGE 4X4 16PLY XRAY LF (GAUZE/BANDAGES/DRESSINGS) IMPLANT
GLOVE BIO SURGEON STRL SZ8 (GLOVE) ×2 IMPLANT
GLOVE BIOGEL PI IND STRL 8 (GLOVE) ×1 IMPLANT
GLOVE BIOGEL PI IND STRL 8.5 (GLOVE) ×1 IMPLANT
GLOVE BIOGEL PI INDICATOR 8 (GLOVE) ×1
GLOVE BIOGEL PI INDICATOR 8.5 (GLOVE) ×1
GLOVE ECLIPSE 7.5 STRL STRAW (GLOVE) ×2 IMPLANT
GLOVE EXAM NITRILE LRG STRL (GLOVE) IMPLANT
GLOVE EXAM NITRILE MD LF STRL (GLOVE) IMPLANT
GLOVE EXAM NITRILE XL STR (GLOVE) IMPLANT
GLOVE EXAM NITRILE XS STR PU (GLOVE) IMPLANT
GOWN STRL REUS W/ TWL LRG LVL3 (GOWN DISPOSABLE) IMPLANT
GOWN STRL REUS W/ TWL XL LVL3 (GOWN DISPOSABLE) IMPLANT
GOWN STRL REUS W/TWL 2XL LVL3 (GOWN DISPOSABLE) IMPLANT
GOWN STRL REUS W/TWL LRG LVL3 (GOWN DISPOSABLE)
GOWN STRL REUS W/TWL XL LVL3 (GOWN DISPOSABLE)
IPG PRECISION SPECTRA (Stimulator) ×1 IMPLANT
KIT BASIN OR (CUSTOM PROCEDURE TRAY) ×2 IMPLANT
KIT CHARGING (KITS) ×1
KIT CHARGING PRECISION NEURO (KITS) IMPLANT
KIT PAT PROGRAM FREELINK (KITS) IMPLANT
KIT ROOM TURNOVER OR (KITS) ×2 IMPLANT
LEAD PADDLE COVERAGE X 50CM (Lead) ×1 IMPLANT
NDL HYPO 25X1 1.5 SAFETY (NEEDLE) ×1 IMPLANT
NEEDLE HYPO 25X1 1.5 SAFETY (NEEDLE) ×2 IMPLANT
NS IRRIG 1000ML POUR BTL (IV SOLUTION) ×2 IMPLANT
PACK LAMINECTOMY NEURO (CUSTOM PROCEDURE TRAY) ×2 IMPLANT
PAD ARMBOARD 7.5X6 YLW CONV (MISCELLANEOUS) ×2 IMPLANT
PADDLE BLANK (MISCELLANEOUS) ×1 IMPLANT
PADDLE BLANK SIMULATOR LEAD 32 (MISCELLANEOUS) ×1 IMPLANT
PASSER ELEVATOR (SPINAL CORD STIMULATOR) IMPLANT
REMOTE CONTROL KIT (KITS) ×2
SPONGE LAP 4X18 X RAY DECT (DISPOSABLE) IMPLANT
SPONGE SURGIFOAM ABS GEL SZ50 (HEMOSTASIS) ×2 IMPLANT
STAPLER SKIN PROX WIDE 3.9 (STAPLE) ×2 IMPLANT
STRIP CLOSURE SKIN 1/2X4 (GAUZE/BANDAGES/DRESSINGS) IMPLANT
SUT SILK 0 TIES 10X30 (SUTURE) IMPLANT
SUT SILK 2 0 FS (SUTURE) ×5 IMPLANT
SUT SILK 2 0 TIES 10X30 (SUTURE) ×1 IMPLANT
SUT VIC AB 0 CT1 18XCR BRD8 (SUTURE) ×1 IMPLANT
SUT VIC AB 0 CT1 8-18 (SUTURE) ×2
SUT VIC AB 2-0 CP2 18 (SUTURE) ×3 IMPLANT
SUT VIC AB 3-0 SH 8-18 (SUTURE) ×3 IMPLANT
TOOL LONG TUNNEL (SPINAL CORD STIMULATOR) ×1 IMPLANT
TOWEL OR 17X24 6PK STRL BLUE (TOWEL DISPOSABLE) ×2 IMPLANT
TOWEL OR 17X26 10 PK STRL BLUE (TOWEL DISPOSABLE) ×2 IMPLANT
WATER STERILE IRR 1000ML POUR (IV SOLUTION) ×2 IMPLANT

## 2015-09-09 NOTE — Anesthesia Preprocedure Evaluation (Signed)
Anesthesia Evaluation  Patient identified by MRN, date of birth, ID band Patient awake    Reviewed: Allergy & Precautions, H&P , Patient's Chart, lab work & pertinent test results, reviewed documented beta blocker date and time   Airway Mallampati: II  TM Distance: >3 FB Neck ROM: full    Dental no notable dental hx.    Pulmonary former smoker,    Pulmonary exam normal breath sounds clear to auscultation       Cardiovascular hypertension,  Rhythm:regular Rate:Normal     Neuro/Psych    GI/Hepatic   Endo/Other    Renal/GU      Musculoskeletal   Abdominal   Peds  Hematology   Anesthesia Other Findings   Reproductive/Obstetrics                           Anesthesia Physical Anesthesia Plan  ASA: II  Anesthesia Plan: General   Post-op Pain Management:    Induction: Intravenous  Airway Management Planned: Oral ETT  Additional Equipment:   Intra-op Plan:   Post-operative Plan: Extubation in OR  Informed Consent: I have reviewed the patients History and Physical, chart, labs and discussed the procedure including the risks, benefits and alternatives for the proposed anesthesia with the patient or authorized representative who has indicated his/her understanding and acceptance.   Dental Advisory Given and Dental advisory given  Plan Discussed with: CRNA and Surgeon  Anesthesia Plan Comments: (  Discussed general anesthesia, including possible nausea, instrumentation of airway, sore throat,pulmonary aspiration, etc. I asked if the were any outstanding questions, or  concerns before we proceeded. )        Anesthesia Quick Evaluation  

## 2015-09-09 NOTE — Interval H&P Note (Signed)
History and Physical Interval Note:  09/09/2015 7:18 AM  Erika Phillips  has presented today for surgery, with the diagnosis of Post laminectomy syndrome  The various methods of treatment have been discussed with the patient and family. After consideration of risks, benefits and other options for treatment, the patient has consented to  Procedure(s) with comments: [place] (N/A) - [place] as a surgical intervention .  The patient's history has been reviewed, patient examined, no change in status, stable for surgery.  I have reviewed the patient's chart and labs.  Questions were answered to the patient's satisfaction.     Rozalynn Buege D

## 2015-09-09 NOTE — Transfer of Care (Signed)
Immediate Anesthesia Transfer of Care Note  Patient: Erika Phillips  Procedure(s) Performed: Procedure(s) with comments: LUMBAR SPINAL CORD STIMULATOR INSERTION (N/A) - LUMBAR SPINAL CORD STIMULATOR INSERTION  Patient Location: PACU  Anesthesia Type:General  Level of Consciousness: awake, oriented and patient cooperative  Airway & Oxygen Therapy: Patient Spontanous Breathing and Patient connected to nasal cannula oxygen  Post-op Assessment: Report given to RN, Post -op Vital signs reviewed and stable and Patient moving all extremities  Post vital signs: Reviewed and stable  Last Vitals:  Filed Vitals:   09/09/15 0817  BP: 158/73  Pulse: 72  Temp: 36.8 C  Resp: 20    Last Pain:  Filed Vitals:   09/09/15 1234  PainSc: 4       Patients Stated Pain Goal: 4 ([id number])  Complications: No apparent anesthesia complications

## 2015-09-09 NOTE — Op Note (Signed)
09/09/2015  12:30 PM  PATIENT:  Erika Phillips  75 y.o. 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 ( right > left)  PROCEDURE:  Procedure(s) with comments: LUMBAR SPINAL CORD STIMULATOR INSERTION (N/A) - LUMBAR SPINAL CORD STIMULATOR INSERTION with Thoracic laminectomy  SURGEON:  Surgeon(s) and Role:    * Erline Levine, MD - Primary  PHYSICIAN ASSISTANT:   ASSISTANTS: Poteat, RN   ANESTHESIA:   general  EBL:  Total I/O In: -  Out: 25 [Blood:25]  BLOOD ADMINISTERED:none  DRAINS: none   LOCAL MEDICATIONS USED:  LIDOCAINE   SPECIMEN:  No Specimen  DISPOSITION OF SPECIMEN:  N/A  COUNTS:  YES  TOURNIQUET:  * No tourniquets in log *  DICTATION: Patient is 75 year old woman with previous fusion surgery who has chronic pain. She did well with a percutaneous trial of spinal cord stimulation and presents for laminectomy and placement of spinal cord stimulator.  Procedure: Following smooth and uncomplicated induction of general endotracheal anesthesia the patient was placed in a prone position on the Wilson frame. C-arm was used to mark the T9-10 intralaminar space. Her right flank was also marked for placement of implantable pulse generator. Her back was then prepped and draped in the usual sterile fashion with Betadine scrub and Duraprep. Area of planned incision was infiltrated with local lidocaine. An incision was made carried to the thoracic fascia which was incised on the right side of midline and a subperiosteal dissection was performed exposing the T9 10 interlaminar space. After confirming correct orientation with C-arm a thoracic laminectomy was performed a high-speed drill and Kerrison rongeurs exposing the spinal cord dura. A 32 contact [employer] paddle electrode was then  inserted in the epidural space and was found to be well positioned within the midline from the top of T 9 up to the superior aspect of T7. The electrode deviated slightly to the right, but the alternative was much more to the left and this was preferred, given the patient's preponderance of pain on her right.  Anchors were then placed in the fascia was closed. A subcutaneous pocket was created to the right of midline. The tunneler was used to create a subcutaneous tract for the electrodes and these were then passed, anchored to the implantable pulse generator and connections were torqued appropriately. Redundant electrode was circularized beneath the IPG and a strain release loop was placed in the laminectomy incision site. The wounds were irrigated and closed with 2-0 and 3-0 Vicryl stitches and the wounds were dressed with Dermabond and an occlusive dressing. Impedances were assessed and found to be correct. Final radiograph demonstrated that the lead was in the midline from the inferior aspect of T7 to the top of T9. Patient was taken to recovery having tolerated procedure well counts were correct at the end of the case.   PLAN OF CARE: Admit to inpatient   PATIENT DISPOSITION:  PACU - hemodynamically stable.   Delay start of Pharmacological VTE agent (>24hrs) due to surgical blood loss or risk of bleeding: yes

## 2015-09-09 NOTE — Progress Notes (Signed)
Awake, alert, conversant.  MAEW with good strength.  Doing well. 

## 2015-09-09 NOTE — Progress Notes (Signed)
Patient alert and oriented, mae's well, voiding adequate amount of urine, swallowing without difficulty, no c/o pain. Patient discharged home with family. Script and discharged instructions given to patient. Patient and family stated understanding of d/c instructions given and has an appointment with MD. 

## 2015-09-09 NOTE — Anesthesia Procedure Notes (Signed)
Procedure Name: Intubation Date/Time: 09/09/2015 10:59 AM Performed by: Melina Copa, Nicki Furlan R Pre-anesthesia Checklist: Patient identified, Emergency Drugs available, Suction available and Patient being monitored Patient Re-evaluated:Patient Re-evaluated prior to inductionOxygen Delivery Method: Circle System Utilized Preoxygenation: Pre-oxygenation with 100% oxygen Intubation Type: IV induction Ventilation: Mask ventilation without difficulty Laryngoscope Size: Mac and 3 Grade View: Grade II Tube type: Oral Number of attempts: 1 Airway Equipment and Method: Stylet Placement Confirmation: ETT inserted through vocal cords under direct vision,  positive ETCO2 and breath sounds checked- equal and bilateral Tube secured with: Tape Dental Injury: Teeth and Oropharynx as per pre-operative assessment

## 2015-09-09 NOTE — Brief Op Note (Signed)
09/09/2015  12:30 PM  PATIENT:  Erika Phillips  75 y.o. 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 ( right > left)  PROCEDURE:  Procedure(s) with comments: LUMBAR SPINAL CORD STIMULATOR INSERTION (N/A) - LUMBAR SPINAL CORD STIMULATOR INSERTION with Thoracic laminectomy  SURGEON:  Surgeon(s) and Role:    * Erline Levine, MD - Primary  PHYSICIAN ASSISTANT:   ASSISTANTS: Poteat, RN   ANESTHESIA:   general  EBL:  Total I/O In: -  Out: 25 [Blood:25]  BLOOD ADMINISTERED:none  DRAINS: none   LOCAL MEDICATIONS USED:  LIDOCAINE   SPECIMEN:  No Specimen  DISPOSITION OF SPECIMEN:  N/A  COUNTS:  YES  TOURNIQUET:  * No tourniquets in log *  DICTATION: Patient is 75 year old woman with previous fusion surgery who has chronic pain. She did well with a percutaneous trial of spinal cord stimulation and presents for laminectomy and placement of spinal cord stimulator.  Procedure: Following smooth and uncomplicated induction of general endotracheal anesthesia the patient was placed in a prone position on the Wilson frame. C-arm was used to mark the T9-10 intralaminar space. Her right flank was also marked for placement of implantable pulse generator. Her back was then prepped and draped in the usual sterile fashion with Betadine scrub and Duraprep. Area of planned incision was infiltrated with local lidocaine. An incision was made carried to the thoracic fascia which was incised on the right side of midline and a subperiosteal dissection was performed exposing the T9 10 interlaminar space. After confirming correct orientation with C-arm a thoracic laminectomy was performed a high-speed drill and Kerrison rongeurs exposing the spinal cord dura. A 32 contact [employer] paddle electrode was then  inserted in the epidural space and was found to be well positioned within the midline from the top of T 9 up to the superior aspect of T7. The electrode deviated slightly to the right, but the alternative was much more to the left and this was preferred, given the patient's preponderance of pain on her right.  Anchors were then placed in the fascia was closed. A subcutaneous pocket was created to the right of midline. The tunneler was used to create a subcutaneous tract for the electrodes and these were then passed, anchored to the implantable pulse generator and connections were torqued appropriately. Redundant electrode was circularized beneath the IPG and a strain release loop was placed in the laminectomy incision site. The wounds were irrigated and closed with 2-0 and 3-0 Vicryl stitches and the wounds were dressed with Dermabond and an occlusive dressing. Impedances were assessed and found to be correct. Final radiograph demonstrated that the lead was in the midline from the inferior aspect of T7 to the top of T9. Patient was taken to recovery having tolerated procedure well counts were correct at the end of the case.   PLAN OF CARE: Admit to inpatient   PATIENT DISPOSITION:  PACU - hemodynamically stable.   Delay start of Pharmacological VTE agent (>24hrs) due to surgical blood loss or risk of bleeding: yes

## 2015-09-09 NOTE — H&P (Signed)
Patient ID:   [id number] Patient: Erika Phillips  Date of Birth: [birth year] Visit Type: Office Visit   Date: 08/10/2015 11:30 AM Provider: Marchia Meiers. Vertell Limber MD   This 75 year old female presents for back pain.  History of Present Illness: 1.  back pain    Erika Phillips, 75 year old retired female visits to discuss spinal cord stimulator placement.  Current symptoms include lumbar pain with burning sensation right greater than left lower extremity buttocks to toes.  Numbness and tingling BLE.  History includes two lumbar fusions by Dr. Mauri Pole 2001 and 2004.  Lumbar fusion by Dr. Ronnald Ramp 2005.  Spinal cord stimulator trial was deemed "very helpful" by patient in February.  History: HTN, peptic ulcers Surgical history: 2001 L4-L5 fusion; 2004 L3-L4 fusion; 2005 ? Lumbar fusion by Dr. Ludwig Clarks 3x/day Hydrocodone  Thoracic x-ray and lumbar MRI on [facility]  X-ray shows thoracic lead on the right in position.  [employer] are present.  Physical Exam:  Right EHL 4-/5, right dorsiflexion 4/5. Left EHL 4+/5        PAST MEDICAL/SURGICAL HISTORY   (Detailed)  Disease/disorder Onset Date Management Date Comments    Spinal fusion, lumbar    Hypertension         PAST MEDICAL HISTORY, SURGICAL HISTORY, FAMILY HISTORY, SOCIAL HISTORY AND REVIEW OF SYSTEMS  08/10/2015, which I have signed.  Family History  (Detailed) Relationship Family Member Name Deceased Age at Death Condition Onset Age Cause of Death      Family history of Peptic ulcer disease  N      Family history of Hypertension  N      Family history of Diabetes mellitus  N  Father    Cancer, unknown  N  Mother    Cancer, unknown  N    SOCIAL HISTORY  (Detailed) Tobacco use reviewed. Preferred language is Unknown.   Smoking status: Never smoker.  SMOKING STATUS Use Status Type Smoking Status Usage Per Day Years Used Total Pack Years  no/never  Never smoker       HOME  ENVIRONMENT/SAFETY The patient has not fallen in the last year.        MEDICATIONS(added, continued or stopped this visit): Started Medication Directions Instruction Stopped   Carafate take 10 milliliter by oral route 4 times every day on an empty stomach 1 hour before meals and at bedtime     Dexilant 60 mg capsule, delayed release take 1 capsule by oral route  every day for 8 weeks     doxepin 10 mg capsule take 1 capsule by oral route  every day     hydrochlorothiazide 25 mg tablet take 1 tablet by oral route  every day     Keflex 250 mg capsule take 1 capsule by oral route  every 8 hours     Limbrel500 500 mg-50 mg capsule 1 tablet by mouth daily.     lisinopril 10 mg tablet take 1 tablet by oral route  every day     methadone 10 mg tablet take 1 tablet by oral route  every 8 hours     Norco 10 mg-325 mg tablet take 1 tablet by oral route  every 8 hours as needed for pain       ALLERGIES: Ingredient Reaction Medication Name Comment  NO KNOWN ALLERGIES     No known allergies.    Vitals Date Temp F BP Pulse Ht In Wt Lb BMI BSA Pain Score  08/10/2015  169/91 67  64 144 24.72  4/10     PHYSICAL EXAM General Level of Distress: no acute distress Overall Appearance: normal    Cardiovascular Cardiac: regular rate and rhythm without murmur  Respiratory Lungs: clear to auscultation  Neurological Recent and Remote Memory: normal Attention Span and Concentration:   normal Language: normal Fund of Knowledge: normal  Right Left Sensation: normal normal Upper Extremity Coordination: normal normal  Lower Extremity Coordination: normal normal  Musculoskeletal Gait and Station: normal  Right Left Upper Extremity Muscle Strength: normal normal Lower Extremity Muscle Strength: normal normal Upper Extremity Muscle Tone:  normal normal Lower Extremity Muscle Tone: normal normal  Motor Strength Upper and lower extremity motor strength was tested in the clinically  pertinent muscles. Any abnormal findings will be noted below.   Right Left Tib Anterior: 4/5  EHL: 4-/5 4+/5   Deep Tendon Reflexes  Right Left Biceps: normal normal Triceps: normal normal Brachiloradialis: normal normal Patellar: normal normal Achilles: normal normal  Sensory Sensation was tested at L1 to S1.   Cranial Nerves II. Optic Nerve/Visual Fields: normal III. Oculomotor: normal IV. Trochlear: normal V. Trigeminal: normal VI. Abducens: normal VII. Facial: normal VIII. Acoustic/Vestibular: normal IX. Glossopharyngeal: normal X. Vagus: normal XI. Spinal Accessory: normal XII. Hypoglossal: normal  Motor and other Tests Lhermittes: negative Rhomberg: negative    Right Left Hoffman's: normal normal Clonus: normal normal Babinski: normal normal SLR: negative negative Patrick's Corky Sox): negative negative Toe Walk: normal normal Toe Lift: normal normal Heel Walk: normal normal SI Joint: nontender nontender   Additional Findings:  Numbness in right greater than left lower extremity    IMPRESSION The patient has benefitted well from her spinal cord stimulator trial and would like to proceed with placement. She has bilateral leg numbness, lumbar pain and bilateral foot weakness; I agree that placement of a spinal cord stimulator would likely manage most of her symptoms. We discussed surgery protocol with [employer] and her, she understands and is agreeable to this.  We discussed placing generator to her right back.  Completed Orders (this encounter) Order Details Reason Side Interpretation Result Initial Treatment Date Region  Hypertension education Follow up with primary care physician for elevated blood pressure.         Assessment/Plan # Detail Type Description   1. Assessment Essential (primary) hypertension (I10).         Pain Assessment/Treatment Pain Scale: 4/10. Method: Numeric Pain Intensity Scale. Location: back. Onset:  04/10/2003. Duration: varies. Quality: discomforting. Pain Assessment/Treatment follow-up plan of care: Patient taking medications as prescribed..  Fall Risk Plan The patient has not fallen in the last year.  I advised her to proceed with her dental crowning before we go with placement. Schedule for spinal cord stimulator.   Orders: Instruction(s)/Education: Assessment Instruction  I10 Hypertension education             Provider:  Marchia Meiers. Vertell Limber MD  08/10/2015 01:12 PM Dictation edited by: Marchia Meiers. Vertell Limber    CC Providers: Nichol[facility] [place], [place] [address]-              Electronically signed by Marchia Meiers Vertell Limber MD on 08/10/2015 01:12 PM

## 2015-09-09 NOTE — Anesthesia Postprocedure Evaluation (Signed)
Anesthesia Post Note  Patient: Erika Phillips  Procedure(s) Performed: Procedure(s) (LRB): LUMBAR SPINAL CORD STIMULATOR INSERTION (N/A)  Patient location during evaluation: PACU Anesthesia Type: General Level of consciousness: sedated Pain management: satisfactory to patient Vital Signs Assessment: post-procedure vital signs reviewed and stable Respiratory status: spontaneous breathing Cardiovascular status: stable Anesthetic complications: no    Last Vitals:  Filed Vitals:   09/09/15 1345 09/09/15 1347  BP:  150/75  Pulse: 71 73  Temp:    Resp: 13 15    Last Pain:  Filed Vitals:   09/09/15 1349  PainSc: 4                  Chares Slaymaker EDWARD

## 2015-09-09 NOTE — Progress Notes (Signed)
PHARMACIST - PHYSICIAN ORDER COMMUNICATION  CONCERNING: P&T Medication Policy on Herbal Medications  DESCRIPTION:  This patient's order for:  Limbrel  has been noted.  This product(s) is classified as an "herbal" or natural product. Due to a lack of definitive safety studies or FDA approval, nonstandard manufacturing practices, plus the potential risk of unknown drug-drug interactions while on inpatient medications, the Pharmacy and Therapeutics Committee does not permit the use of "herbal" or natural products of this type within [facility].   ACTION TAKEN: The pharmacy department is unable to verify this order at this time and your patient has been informed of this safety policy. Please reevaluate patient's clinical condition at discharge and address if the herbal or natural product(s) should be resumed at that time.    Alycia Rossetti, PharmD, BCPS Clinical Pharmacist Pager: [phone] 09/09/2015 2:47 PM

## 2015-09-12 ENCOUNTER — Encounter (HOSPITAL_COMMUNITY): Payer: Self-pay | Admitting: Neurosurgery

## 2015-09-12 NOTE — Discharge Summary (Signed)
Physician Discharge Summary  Patient ID: JOLYNE OXENDINE MRN: [id number] DOB/AGE: Oct 10, 1940 75 y.o.  Admit date: 09/09/2015 Discharge date: 09/12/2015  Admission Diagnoses:chronic postlaminectomy pain syndrome  Discharge Diagnoses: Same Active Problems:   Post laminectomy syndrome   Discharged Condition: good  Hospital Course: Patient underwent placement of spinal cord stimulator via thoracic laminectomy and did well postoperatively.  Consults: None  Significant Diagnostic Studies: None  Treatments: surgery: placement of spinal cord stimulator via thoracic laminectomy  Discharge Exam: Blood pressure 144/78, pulse 74, temperature 97.8 F (36.6 C), temperature source Oral, resp. rate 18, height 5\' 4"  (1.626 m), weight 64.864 kg (143 lb), SpO2 98 %. Neurologic: Alert and oriented X 3, normal strength and tone. Normal symmetric reflexes. Normal coordination and gait Wound:CDI  Disposition: Home     Medication List    ASK your doctor about these medications        azelastine 0.1 % nasal spray  Commonly known as:  ASTELIN  Place 1 spray into both nostrils daily as needed for rhinitis. Use in each nostril as directed     dexlansoprazole 60 MG capsule  Commonly known as:  DEXILANT  Take 60 mg by mouth daily.     doxepin 10 MG capsule  Commonly known as:  SINEQUAN  Take 10 mg by mouth at bedtime as needed. 1-2 capsules at bedtime for sleep     hydrochlorothiazide 25 MG tablet  Commonly known as:  HYDRODIURIL  Take 25 mg by mouth daily.     HYDROcodone-acetaminophen 10-325 MG tablet  Commonly known as:  NORCO  Take 1 tablet by mouth every 4 (four) hours as needed for moderate pain.     hydroxypropyl methylcellulose / hypromellose 2.5 % ophthalmic solution  Commonly known as:  ISOPTO TEARS / GONIOVISC  1 drop.     LIMBREL500 500-50 MG Caps  Generic drug:  Flavocoxid-Cit Zn Bisglcinate  Take 1 capsule by mouth 2 (two) times daily.     lisinopril 10 MG tablet   Commonly known as:  PRINIVIL,ZESTRIL  Take 10 mg by mouth daily.     methadone 10 MG tablet  Commonly known as:  DOLOPHINE  Take 10 mg by mouth every 8 (eight) hours.     sucralfate 1 g tablet  Commonly known as:  CARAFATE  Take 1 g by mouth daily.         Signed: Peggyann Shoals, MD 09/12/2015, 8:24 AM

## 2016-05-07 ENCOUNTER — Ambulatory Visit
Admission: RE | Admit: 2016-05-07 | Discharge: 2016-05-07 | Disposition: A | Discharge status: Home / Self Care | Payer: Medicare Other | Source: Ambulatory Visit | Attending: Gastroenterology | Admitting: Gastroenterology

## 2016-05-07 ENCOUNTER — Ambulatory Visit: Payer: Medicare Other | Admitting: Anesthesiology

## 2016-05-07 ENCOUNTER — Encounter
Admission: RE | Disposition: A | Discharge status: Home / Self Care | Payer: Self-pay | Source: Ambulatory Visit | Attending: Gastroenterology

## 2016-05-07 ENCOUNTER — Encounter: Payer: Self-pay | Admitting: *Deleted

## 2016-05-07 DIAGNOSIS — I10 Essential (primary) hypertension: Secondary | ICD-10-CM | POA: Insufficient documentation

## 2016-05-07 DIAGNOSIS — K2960 Other gastritis without bleeding: Secondary | ICD-10-CM | POA: Insufficient documentation

## 2016-05-07 DIAGNOSIS — Z87891 Personal history of nicotine dependence: Secondary | ICD-10-CM | POA: Insufficient documentation

## 2016-05-07 DIAGNOSIS — Z8711 Personal history of peptic ulcer disease: Secondary | ICD-10-CM | POA: Insufficient documentation

## 2016-05-07 DIAGNOSIS — K319 Disease of stomach and duodenum, unspecified: Secondary | ICD-10-CM | POA: Diagnosis not present

## 2016-05-07 DIAGNOSIS — K2950 Unspecified chronic gastritis without bleeding: Secondary | ICD-10-CM | POA: Insufficient documentation

## 2016-05-07 DIAGNOSIS — K219 Gastro-esophageal reflux disease without esophagitis: Secondary | ICD-10-CM | POA: Insufficient documentation

## 2016-05-07 DIAGNOSIS — K2270 Barrett's esophagus without dysplasia: Secondary | ICD-10-CM | POA: Diagnosis present

## 2016-05-07 DIAGNOSIS — M1990 Unspecified osteoarthritis, unspecified site: Secondary | ICD-10-CM | POA: Insufficient documentation

## 2016-05-07 DIAGNOSIS — Z809 Family history of malignant neoplasm, unspecified: Secondary | ICD-10-CM | POA: Diagnosis not present

## 2016-05-07 DIAGNOSIS — G039 Meningitis, unspecified: Secondary | ICD-10-CM | POA: Insufficient documentation

## 2016-05-07 DIAGNOSIS — Z8719 Personal history of other diseases of the digestive system: Secondary | ICD-10-CM | POA: Insufficient documentation

## 2016-05-07 DIAGNOSIS — Z79899 Other long term (current) drug therapy: Secondary | ICD-10-CM | POA: Insufficient documentation

## 2016-05-07 DIAGNOSIS — E785 Hyperlipidemia, unspecified: Secondary | ICD-10-CM | POA: Diagnosis not present

## 2016-05-07 DIAGNOSIS — Z8379 Family history of other diseases of the digestive system: Secondary | ICD-10-CM | POA: Insufficient documentation

## 2016-05-07 DIAGNOSIS — Z833 Family history of diabetes mellitus: Secondary | ICD-10-CM | POA: Insufficient documentation

## 2016-05-07 HISTORY — PX: ESOPHAGOGASTRODUODENOSCOPY (EGD) WITH PROPOFOL: SHX5813

## 2016-05-07 SURGERY — ESOPHAGOGASTRODUODENOSCOPY (EGD) WITH PROPOFOL
Anesthesia: General

## 2016-05-07 MED ORDER — LIDOCAINE HCL (CARDIAC) 20 MG/ML IV SOLN
INTRAVENOUS | Status: DC | PRN
  Administered 2016-05-07: 100 mg via INTRAVENOUS

## 2016-05-07 MED ORDER — PROPOFOL 500 MG/50ML IV EMUL
INTRAVENOUS | Status: DC | PRN
  Administered 2016-05-07: 100 ug/kg/min via INTRAVENOUS

## 2016-05-07 MED ORDER — PROPOFOL 10 MG/ML IV BOLUS
INTRAVENOUS | Status: DC | PRN
  Administered 2016-05-07: 70 mg via INTRAVENOUS

## 2016-05-07 MED ORDER — SODIUM CHLORIDE 0.9 % IV SOLN
INTRAVENOUS | Status: DC
  Administered 2016-05-07: 1000 mL via INTRAVENOUS

## 2016-05-07 MED ORDER — LACTATED RINGERS IV SOLN
INTRAVENOUS | Status: DC | PRN
  Administered 2016-05-07: 13:00:00 via INTRAVENOUS

## 2016-05-07 MED ORDER — SODIUM CHLORIDE 0.9 % IV SOLN: INTRAVENOUS | Status: DC

## 2016-05-07 NOTE — Anesthesia Postprocedure Evaluation (Signed)
Anesthesia Post Note  Patient: Erika Phillips  Procedure(s) Performed: Procedure(s) (LRB): ESOPHAGOGASTRODUODENOSCOPY (EGD) WITH PROPOFOL (N/A)  Patient location during evaluation: Endoscopy Anesthesia Type: General Level of consciousness: awake and alert Pain management: pain level controlled Vital Signs Assessment: post-procedure vital signs reviewed and stable Respiratory status: spontaneous breathing, nonlabored ventilation, respiratory function stable and patient connected to nasal cannula oxygen Cardiovascular status: blood pressure returned to baseline and stable Postop Assessment: no signs of nausea or vomiting Anesthetic complications: no     Last Vitals:  Vitals:   05/07/16 1119 05/07/16 1307  BP: (!) 152/84 122/70  Pulse: 72   Resp: 20   Temp: 36.2 C 36.5 C    Last Pain:  Vitals:   05/07/16 1307  TempSrc: Tympanic                 Avanni Turnbaugh S

## 2016-05-07 NOTE — Anesthesia Post-op Follow-up Note (Cosign Needed)
Anesthesia QCDR form completed.        

## 2016-05-07 NOTE — H&P (Signed)
Outpatient short stay form Pre-procedure 05/07/2016 12:33 PM Lollie Sails MD  Primary Physician: Dr. Ramonita Lab  Reason for visit:  EGD  History of present illness:  Patient is a 76 year old female presenting today as above. She has a personal history of Barrett's esophagus. Her last procedure was about 2 years ago. She does have a history of a gastric ulcer in the past as well. She is been doing well taking a proton pump inhibitor and low-dose Carafate at times.    Current Facility-Administered Medications:  .  0.9 %  sodium chloride infusion, , Intravenous, Continuous, Lollie Sails, MD, Last Rate: 20 mL/hr at 05/07/16 1133, 1,000 mL at 05/07/16 1133 .  0.9 %  sodium chloride infusion, , Intravenous, Continuous, Lollie Sails, MD  Prescriptions Prior to Admission  Medication Sig Dispense Refill Last Dose  . azelastine (ASTELIN) 0.1 % nasal spray Place 1 spray into both nostrils daily as needed for rhinitis. Use in each nostril as directed   05/07/2016 at 0500  . dexlansoprazole (DEXILANT) 60 MG capsule Take 60 mg by mouth daily.   05/07/2016 at 0600  . hydrochlorothiazide (HYDRODIURIL) 25 MG tablet Take 25 mg by mouth daily.   05/07/2016 at 0600  . HYDROcodone-acetaminophen (NORCO) 10-325 MG tablet Take 1 tablet by mouth every 4 (four) hours as needed for moderate pain.   05/07/2016 at 0600  . lisinopril (PRINIVIL,ZESTRIL) 10 MG tablet Take 10 mg by mouth daily.   05/07/2016 at 0600  . methadone (DOLOPHINE) 10 MG tablet Take 10 mg by mouth every 8 (eight) hours.   05/07/2016 at 0600  . doxepin (SINEQUAN) 10 MG capsule Take 10 mg by mouth at bedtime as needed. 1-2 capsules at bedtime for sleep   09/08/2015 at Unknown time  . hydroxypropyl methylcellulose / hypromellose (ISOPTO TEARS / GONIOVISC) 2.5 % ophthalmic solution 1 drop.   09/09/2015 at 0530  . sucralfate (CARAFATE) 1 g tablet Take 1 g by mouth daily.   09/09/2015 at 0530  . [DISCONTINUED] Flavocoxid-Cit Zn Bisglcinate  (LIMBREL500) 500-50 MG CAPS Take 1 capsule by mouth 2 (two) times daily.   09/08/2015 at Unknown time     No Known Allergies   Past Medical History:  Diagnosis Date  . Arachnoiditis   . Arthritis   . Foot fracture, right   . Gastritis   . GERD (gastroesophageal reflux disease)   . Hepatic hemangioma    pt denies  . Hyperlipidemia   . Hypertension   . Neuromuscular disorder (HCC)    numbness and tingling bilateral legs  . Reflux esophagitis     Review of systems:      Physical Exam    Heart and lungs: Regular rate and rhythm without rub or gallop, lungs are bilaterally clear.    HEENT: Normocephalic atraumatic eyes are anicteric    Other:     Pertinant exam for procedure: Soft nontender nondistended bowel sounds positive normoactive.    Planned proceedures: EGD and indicated procedures.have discussed the risks benefits and complications of procedures to include not limited to bleeding, infection, perforation and the risk of sedation and the patient wishes to proceed.    Lollie Sails, MD Gastroenterology 05/07/2016  12:33 PM

## 2016-05-07 NOTE — Anesthesia Preprocedure Evaluation (Addendum)
Anesthesia Evaluation  Patient identified by MRN, date of birth, ID band Patient awake    Reviewed: Allergy & Precautions, NPO status , Patient's Chart, lab work & pertinent test results, reviewed documented beta blocker date and time   Airway Mallampati: II  TM Distance: >3 FB     Dental  (+) Chipped   Pulmonary former smoker,           Cardiovascular hypertension,      Neuro/Psych  Neuromuscular disease    GI/Hepatic GERD  ,  Endo/Other    Renal/GU      Musculoskeletal  (+) Arthritis ,   Abdominal   Peds  (+) premature delivery Hematology   Anesthesia Other Findings Spinal cord stimulation. Will cut off prior to procedure.  Reproductive/Obstetrics                            Anesthesia Physical Anesthesia Plan  ASA: III  Anesthesia Plan: General   Post-op Pain Management:    Induction: Intravenous  Airway Management Planned: Nasal Cannula  Additional Equipment:   Intra-op Plan:   Post-operative Plan:   Informed Consent: I have reviewed the patients History and Physical, chart, labs and discussed the procedure including the risks, benefits and alternatives for the proposed anesthesia with the patient or authorized representative who has indicated his/her understanding and acceptance.     Plan Discussed with: CRNA  Anesthesia Plan Comments:         Anesthesia Quick Evaluation

## 2016-05-07 NOTE — Transfer of Care (Signed)
Immediate Anesthesia Transfer of Care Note  Patient: Erika Phillips  Procedure(s) Performed: Procedure(s): ESOPHAGOGASTRODUODENOSCOPY (EGD) WITH PROPOFOL (N/A)  Patient Location: PACU  Anesthesia Type:General  Level of Consciousness: sedated  Airway & Oxygen Therapy: Patient Spontanous Breathing and Patient connected to nasal cannula oxygen  Post-op Assessment: Report given to RN and Post -op Vital signs reviewed and stable  Post vital signs: Reviewed and stable  Last Vitals:  Vitals:   05/07/16 1119  BP: (!) 152/84  Pulse: 72  Resp: 20  Temp: 36.2 C    Last Pain:  Vitals:   05/07/16 1119  TempSrc: Tympanic         Complications: No apparent anesthesia complications

## 2016-05-07 NOTE — Op Note (Signed)
[facility] Patient Name: Erika Phillips Procedure Date: 05/07/2016 12:28 PM MRN: [id number] Account #: [id number] Date of Birth: 1941-01-22 Admit Type: Outpatient Age: 76 Room: [facility] ENDO ROOM 3 Gender: Female Note Status: Finalized Procedure:            Upper GI endoscopy Indications:          Follow-up of Barrett's esophagus Providers:            Lollie Sails, MD Medicines:            Monitored Anesthesia Care Complications:        No immediate complications. Procedure:            Pre-Anesthesia Assessment:                       - ASA Grade Assessment: III - A patient with severe                        systemic disease.                       After obtaining informed consent, the endoscope was                        passed under direct vision. Throughout the procedure,                        the patient's blood pressure, pulse, and oxygen                        saturations were monitored continuously. The Endoscope                        was introduced through the mouth, and advanced to the                        third part of duodenum. The upper GI endoscopy was                        accomplished without difficulty. The patient tolerated                        the procedure well. Findings:      The Z-line was variable.      There were esophageal mucosal changes suspicious for short-segment       Barrett's esophagus present at the gastroesophageal junction. Mucosa was       biopsied with a cold forceps for histology in 4 quadrants. One specimen       bottle was sent to pathology.      Patchy mild inflammation characterized by congestion (edema), erythema       and granularity was found in the gastric antrum. Biopsies were taken       with a cold forceps for histology. Biopsies were taken with a cold       forceps for Helicobacter pylori testing.      The cardia and gastric fundus were normal on retroflexion.      A single localized, 2 mm  non-bleeding erosion was found at the pylorus       channel, possible irritation from passage of scope. There were no       stigmata of recent  bleeding.      The examined duodenum was normal. Impression:           - Z-line variable.                       - Esophageal mucosal changes suspicious for                        short-segment Barrett's esophagus. Biopsied.                       - Erosive gastritis. Biopsied.                       - Non-bleeding erosive gastropathy.                       - Normal examined duodenum. Recommendation:       - Await pathology results.                       - Use Protonix (pantoprazole) 40 mg PO daily daily.                       - Use sucralfate 1 gram PO tid.                       - Await pathology results.                       - Return to GI clinic in 4 weeks. Procedure Code(s):    --- Professional ---                       [phone], Esophagogastroduodenoscopy, flexible, transoral;                        with biopsy, single or multiple Diagnosis Code(s):    --- Professional ---                       K22.8, Other specified diseases of esophagus                       K22.70, Barrett's esophagus without dysplasia                       K29.60, Other gastritis without bleeding                       K31.89, Other diseases of stomach and duodenum CPT copyright 2016 American Medical Association. All rights reserved. The codes documented in this report are preliminary and upon coder review may  be revised to meet current compliance requirements. Lollie Sails, MD 05/07/2016 1:07:37 PM This report has been signed electronically. Number of Addenda: 0 Note Initiated On: 05/07/2016 12:28 PM      [facility]

## 2016-05-08 ENCOUNTER — Encounter: Payer: Self-pay | Admitting: Gastroenterology

## 2016-05-09 LAB — SURGICAL PATHOLOGY

## 2016-05-22 ENCOUNTER — Other Ambulatory Visit: Payer: Self-pay | Admitting: Internal Medicine

## 2016-05-22 DIAGNOSIS — Z1231 Encounter for screening mammogram for malignant neoplasm of breast: Secondary | ICD-10-CM

## 2016-05-28 ENCOUNTER — Ambulatory Visit
Admission: RE | Admit: 2016-05-28 | Discharge: 2016-05-28 | Disposition: A | Discharge status: Home / Self Care | Payer: Medicare Other | Source: Ambulatory Visit | Attending: Internal Medicine | Admitting: Internal Medicine

## 2016-05-28 ENCOUNTER — Other Ambulatory Visit: Payer: Self-pay | Admitting: Internal Medicine

## 2016-05-28 DIAGNOSIS — Z1231 Encounter for screening mammogram for malignant neoplasm of breast: Secondary | ICD-10-CM | POA: Insufficient documentation

## 2017-05-28 ENCOUNTER — Other Ambulatory Visit: Payer: Self-pay | Admitting: Internal Medicine

## 2017-05-28 DIAGNOSIS — Z1231 Encounter for screening mammogram for malignant neoplasm of breast: Secondary | ICD-10-CM

## 2017-06-03 ENCOUNTER — Ambulatory Visit
Admission: RE | Admit: 2017-06-03 | Discharge: 2017-06-03 | Disposition: A | Discharge status: Home / Self Care | Payer: Medicare Other | Source: Ambulatory Visit | Attending: Internal Medicine | Admitting: Internal Medicine

## 2017-06-03 DIAGNOSIS — Z1231 Encounter for screening mammogram for malignant neoplasm of breast: Secondary | ICD-10-CM | POA: Diagnosis present

## 2018-07-17 ENCOUNTER — Other Ambulatory Visit: Payer: Self-pay | Admitting: Internal Medicine

## 2018-07-17 DIAGNOSIS — Z1231 Encounter for screening mammogram for malignant neoplasm of breast: Secondary | ICD-10-CM

## 2018-09-16 ENCOUNTER — Encounter (INDEPENDENT_AMBULATORY_CARE_PROVIDER_SITE_OTHER): Payer: Self-pay

## 2018-09-16 ENCOUNTER — Other Ambulatory Visit: Payer: Self-pay

## 2018-09-16 ENCOUNTER — Ambulatory Visit
Admission: RE | Admit: 2018-09-16 | Discharge: 2018-09-16 | Disposition: A | Discharge status: Home / Self Care | Payer: Medicare Other | Source: Ambulatory Visit | Attending: Internal Medicine | Admitting: Internal Medicine

## 2018-09-16 DIAGNOSIS — Z1231 Encounter for screening mammogram for malignant neoplasm of breast: Secondary | ICD-10-CM | POA: Diagnosis not present

## 2018-11-24 ENCOUNTER — Other Ambulatory Visit: Payer: Self-pay | Admitting: Internal Medicine

## 2018-11-24 DIAGNOSIS — R4701 Aphasia: Secondary | ICD-10-CM

## 2018-11-28 ENCOUNTER — Other Ambulatory Visit: Payer: Self-pay

## 2018-11-28 ENCOUNTER — Ambulatory Visit
Admission: RE | Admit: 2018-11-28 | Discharge: 2018-11-28 | Disposition: A | Discharge status: Home / Self Care | Payer: Medicare Other | Source: Ambulatory Visit | Attending: Internal Medicine | Admitting: Internal Medicine

## 2018-11-28 ENCOUNTER — Encounter (INDEPENDENT_AMBULATORY_CARE_PROVIDER_SITE_OTHER): Payer: Self-pay

## 2018-11-28 DIAGNOSIS — R4701 Aphasia: Secondary | ICD-10-CM | POA: Diagnosis present

## 2019-03-26 ENCOUNTER — Other Ambulatory Visit: Payer: Self-pay

## 2019-03-26 ENCOUNTER — Encounter: Payer: Self-pay | Admitting: Speech Pathology

## 2019-03-26 ENCOUNTER — Ambulatory Visit: Payer: Medicare Other | Attending: Neurology | Admitting: Speech Pathology

## 2019-03-26 DIAGNOSIS — R41841 Cognitive communication deficit: Secondary | ICD-10-CM | POA: Insufficient documentation

## 2019-03-26 NOTE — Therapy (Signed)
[place] MAIN [facility] SERVICES [address] [place], [place], [address] Phone: [phone]   Fax:  [phone]  Speech Language Pathology Evaluation  Patient Details  Name: Erika Phillips MRN: [id number] Date of Birth: [birth year] No data recorded  Encounter Date: 03/26/2019  End of Session - 03/26/19 1416    Visit Number  1    Number of Visits  17    Date for SLP Re-Evaluation  05/27/19    SLP Start Time  1312   pt arrived late to evaluation   SLP Stop Time   1400    SLP Time Calculation (min)  48 min    Activity Tolerance  Patient tolerated treatment well       Past Medical History:  Diagnosis Date  . Arachnoiditis   . Arthritis   . Foot fracture, right   . Gastritis   . GERD (gastroesophageal reflux disease)   . Hepatic hemangioma    pt denies  . Hyperlipidemia   . Hypertension   . Neuromuscular disorder (HCC)    numbness and tingling bilateral legs  . Reflux esophagitis     Past Surgical History:  Procedure Laterality Date  . CATARACT EXTRACTION     bilateral eyes  . COLONOSCOPY    . DENTAL SURGERY     crown put on  . ESOPHAGOGASTRODUODENOSCOPY    . ESOPHAGOGASTRODUODENOSCOPY (EGD) WITH PROPOFOL N/A 05/07/2016   Procedure: ESOPHAGOGASTRODUODENOSCOPY (EGD) WITH PROPOFOL;  Surgeon: Lollie Sails, MD;  Location: [facility] ENDOSCOPY;  Service: Endoscopy;  Laterality: N/A;  . LUMBAR LAMINECTOMY    . SPINAL CORD STIMULATOR INSERTION N/A 09/09/2015   Procedure: LUMBAR SPINAL CORD STIMULATOR INSERTION;  Surgeon: Erline Levine, MD;  Location: [place] NEURO ORS;  Service: Neurosurgery;  Laterality: N/A;  LUMBAR SPINAL CORD STIMULATOR INSERTION  . TUBAL LIGATION      There were no vitals filed for this visit.  Subjective Assessment - 03/26/19 1407    Subjective  Pt was delightful, smiling and very talkative throughout evaluation.         SLP Evaluation [facility] - 03/26/19 1417      SLP Visit Information   SLP Received On  03/26/19     Onset Date  approximately 2019    Medical Diagnosis  Aphasia      General Information   HPI  This very pleasant pt of [name] is here seeking assistance with increasing c/o of word finding. She stated she has noticed an increasing problem with finding her words when she's talking and an increased difficulty remembering names. She said she has also noticed changes in her memory since 2019. CT head without contrast on 11/28/2018 revealed no acute infarct, no mass or hemorrhage. Noted age related volume loss. Family notes she loses her train of thought during conversation.  No hx of head trauma or family hx of dementia. Pt denies issues with driving, managing finances. Stated she is able to cook and clean without mistakes. She has noticed some changes in her balance.     Balance Screen   Has the patient fallen in the past 6 months  No    Has the patient had a decrease in activity level because of a fear of falling?   No    Is the patient reluctant to leave their home because of a fear of falling?   No      Prior Functional Status   Cognitive/Linguistic Baseline  Information not available    Type of  Home  Apartment     Lives With  Spouse    Available Support  Family    Education  High school    Vocation  Retired   Network engineer at [employer]   Overall Cognitive Status  No family/caregiver present to determine baseline cognitive functioning    Attention  Focused;Sustained;Selective;Alternating;Divided    Focused Attention  Impaired    Sustained Attention  Impaired    Alternating Attention  Impaired    Divided Attention  Impaired    Memory  Impaired    Memory Impairment  Storage deficit;Retrieval deficit;Decreased recall of new information;Decreased short term memory    Awareness  Impaired    Magazine features editor Comprehension   Overall Auditory Comprehension  Appears within functional limits  for tasks assessed      Reading Comprehension   Reading Status  Impaired    Effective Techniques  Verbal cueing;Visual cueing      Expression   Primary Mode of Expression  Verbal      Verbal Expression   Overall Verbal Expression  Appears within functional limits for tasks assessed    Initiation  No impairment    Level of Generative/Spontaneous Verbalization  Conversation    Repetition  Impaired    Level of Impairment  Sentence level    Naming  No impairment    Pragmatics  No impairment    Interfering Components  Attention      Written Expression   Dominant Hand  Right      Oral Motor/Sensory Function   Overall Oral Motor/Sensory Function  Appears within functional limits for tasks assessed      Motor Speech   Overall Motor Speech  Appears within functional limits for tasks assessed      Standardized Assessments   Standardized Assessments   Montreal Cognitive Assessment (MOCA)    Montreal Cognitive Assessment (MOCA)   17/30= Mod cognitive impairment       MOCA, version 8.2   Visuospatial/Executive Functioning:         0/5             Alternating Trail Making: 0/1             Visuoconstruction Skills: 0/1             Draw a clock: 0/3 Naming:                                                          3/3  Attention:                                                       4/6             Forward digit span, 5 digits: 1/1             Reverse digit span, 3 digits: 1/1             Vigilance: 1/1             Serial 7's: 1/3  Language:                                                      1/3             Verbal Fluency 1/1             Repetition: 0/2 Abstraction:                                                   1/2 Delayed Recall:                                              2/5  Memory Index Score: 7/15 Orientation:                                                    6/6   TOTAL      17/30= Mod cognitive impairment  Cognitive Communication Assessment (MedSLP's informal  assessment)*  Orientation     3/3  Written Expression      Personal Information  2/3  Functional Messages/Taking Notes 0/2  Clock Drawing   0/3  Reading Comprehension     Paragraph Comprehension  3/4  Functional Reading   1/4  Cognition      Calculating Change    0/3      TOTAL     9/22  **Full assessment unable to be fully completed due to time constraints, completion of assessment planned to be integrated into upcoming tx sessions.      SLP Education - 03/26/19 1416    Education Details  re: role of SLP in cognitive training    Person(s) Educated  Patient    Methods  Explanation;Verbal cues    Comprehension  Verbalized understanding         SLP Long Term Goals - 03/26/19 1418      SLP LONG TERM GOAL #1   Title  Pt will use external memory aids & compensatory strategies to recall routine & personal information, and recent events with 80% accuracy.    Time  8    Period  Weeks    Status  New    Target Date  05/27/19      SLP LONG TERM GOAL #2   Title  Pt will complete auditory and visual attention/vigilance/memory tasks with 80% accuracy given min cues.    Time  8    Period  Weeks    Status  New    Target Date  05/27/19      SLP LONG TERM GOAL #3   Title  Pt will recall facts, answer wh-questions following reading short paragraphs/functional reading materials [employer], receipts, advertisement, bills, etc) with 80% accuracy.    Time  8    Period  Weeks    Status  New    Target Date  05/27/19      SLP LONG TERM GOAL #4   Title  Pt will give at least 3 semantic descriptors when experiencing word finding difficulty with 100% accuracy.    Time  8    Period  Weeks    Status  New    Target Date  05/27/19                Plan - 03/26/19 1417    Clinical Impression Statement  This very pleasant 78 y/o female presents with mod cognitive linguistic impairment c/b decreased visuospatial, executive functioning, memory, attention, and reading comprehension skills.  Relative strengths include simple expressive language tasks, simple auditory comprehension tasks, and orientation. Further assessment to be completed in future tx sessions, as pt arrived late to evaluation. Full assessment could not be completed due to time constraints. Pt will benefit from skilled SLP tx to improve cognitive linguistic function and provide pt education re: compensatory strategies to improve pt safety and functional independence.   Speech Therapy Frequency  2x / week    Duration  Other (comment)   8 weeks   Treatment/Interventions  SLP instruction and feedback;Cueing hierarchy;Cognitive reorganization;Compensatory strategies;Functional tasks;Patient/family education;Internal/external aids    Potential to Achieve Goals  Good    Potential Considerations  Family/community support;Previous level of function;Cooperation/participation level    SLP Home Exercise Plan  TBD    Consulted and Agree with Plan of Care  Patient       Patient will benefit from skilled therapeutic intervention in order to improve the following deficits and impairments:   Cognitive communication deficit    Problem List Patient Active Problem List   Diagnosis Date Noted  . 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

## 2019-04-06 ENCOUNTER — Ambulatory Visit: Payer: Medicare Other | Admitting: Speech Pathology

## 2019-04-06 ENCOUNTER — Other Ambulatory Visit: Payer: Self-pay

## 2019-04-06 DIAGNOSIS — R41841 Cognitive communication deficit: Secondary | ICD-10-CM | POA: Diagnosis not present

## 2019-04-06 NOTE — Therapy (Signed)
[place] MAIN [facility] SERVICES [address] [place], [place], [address] Phone: [phone]   Fax:  [phone]  Speech Language Pathology Treatment  Patient Details  Name: Erika Phillips MRN: [id number] Date of Birth: [birth year] No data recorded  Encounter Date: 04/06/2019  End of Session - 04/06/19 1609    Visit Number  2    Number of Visits  17    Date for SLP Re-Evaluation  05/27/19    SLP Start Time  1508   patient arrived late   SLP Stop Time   1600    SLP Time Calculation (min)  52 min    Activity Tolerance  Patient tolerated treatment well       Past Medical History:  Diagnosis Date  . Arachnoiditis   . Arthritis   . Foot fracture, right   . Gastritis   . GERD (gastroesophageal reflux disease)   . Hepatic hemangioma    pt denies  . Hyperlipidemia   . Hypertension   . Neuromuscular disorder (HCC)    numbness and tingling bilateral legs  . Reflux esophagitis     Past Surgical History:  Procedure Laterality Date  . CATARACT EXTRACTION     bilateral eyes  . COLONOSCOPY    . DENTAL SURGERY     crown put on  . ESOPHAGOGASTRODUODENOSCOPY    . ESOPHAGOGASTRODUODENOSCOPY (EGD) WITH PROPOFOL N/A 05/07/2016   Procedure: ESOPHAGOGASTRODUODENOSCOPY (EGD) WITH PROPOFOL;  Surgeon: Lollie Sails, MD;  Location: [facility] ENDOSCOPY;  Service: Endoscopy;  Laterality: N/A;  . LUMBAR LAMINECTOMY    . SPINAL CORD STIMULATOR INSERTION N/A 09/09/2015   Procedure: LUMBAR SPINAL CORD STIMULATOR INSERTION;  Surgeon: Erline Levine, MD;  Location: [place] NEURO ORS;  Service: Neurosurgery;  Laterality: N/A;  LUMBAR SPINAL CORD STIMULATOR INSERTION  . TUBAL LIGATION      There were no vitals filed for this visit.  Subjective Assessment - 04/06/19 1510    Subjective  "One of them already had... whatever it is." (covid)    Currently in Pain?  Yes    Pain Score  5     Pain Location  Back            ADULT SLP TREATMENT - 04/06/19 1511      General Information   Behavior/Cognition  Alert;Pleasant mood;Cooperative    HPI  This very pleasant pt of [name] is here seeking assistance with increasing c/o of word finding. She stated she has noticed an increasing problem with finding her words when she's talking and an increased difficulty remembering names. She said she has also noticed changes in her memory since 2019. CT head without contrast on 11/28/2018 revealed no acute infarct, no mass or hemorrhage. Noted age related volume loss. Family notes she loses her train of thought during conversation.  No hx of head trauma or family hx of dementia. Pt denies issues with driving, managing finances. Stated she is able to cook and clean without mistakes. She has noticed some changes in her balance.      Treatment Provided   Treatment provided  Cognitive-Linquistic      Cognitive-Linquistic Treatment   Treatment focused on  Cognition;Aphasia;Patient/family/caregiver education    Skilled Treatment  Based on patient's "s" statement, SLP used anomic instance as stimulus for training self-cuing/semantic description. She generated 2 categories, 3 physical descriptions, 1 location, and 2 associations with occasional min question cues. Continued with Cognitive Communication Assessment (MedSLP informal assessment) with scores as follows: Immediate recall 2/6  Recent Memory 3/3 Distant Memory 3/3 Making Correct Change 3/3 Calculating Coins 0/3 Functional Calculations 1/5 Problem Solving/Safety 4/4 Sequencing 3/3 Organization 2/6  Patient frustrated easily by calculations. To complete remainder of assessment in subsequent sessions.       Assessment / Recommendations / Plan   Plan  Continue with current plan of care      Progression Toward Goals   Progression toward goals  Progressing toward goals       SLP Education - 04/06/19 1610    Education Details  compensations for anomia    Person(s) Educated  Patient    Methods   Explanation;Verbal cues    Comprehension  Verbalized understanding;Verbal cues required;Need further instruction         SLP Long Term Goals - 04/06/19 1609      SLP LONG TERM GOAL #1   Title  Pt will use external memory aids & compensatory strategies to recall routine & personal information, and recent events with 80% accuracy.    Time  8    Period  Weeks    Status  On-going      SLP LONG TERM GOAL #2   Title  Pt will complete auditory and visual attention/vigilance/memory tasks with 80% accuracy given min cues.    Time  8    Period  Weeks    Status  On-going      SLP LONG TERM GOAL #3   Title  Pt will recall facts, answer wh-questions following reading short paragraphs/functional reading materials [employer], receipts, advertisement, bills, etc) with 80% accuracy.    Time  8    Period  Weeks    Status  On-going      SLP LONG TERM GOAL #4   Title  Pt will give at least 3 semantic descriptors when experiencing word finding difficulty with 100% accuracy.    Time  8    Period  Weeks    Status  On-going       Plan - 04/06/19 1610    Clinical Impression Statement Patient presents with moderate cognitive communication impairments in visuospatial, executive functioning, memory, attention, and reading comprehension skills. She struggled with calculation tasks today, with attention and working memory deficits contributing. Anomic instances today in conversation x4. Pt will benefit from skilled SLP tx to improve cognitive linguistic function and provide pt education re: compensatory strategies to improve pt safety and functional independence.   Speech Therapy Frequency  2x / week    Duration  Other (comment)   8 weeks   Treatment/Interventions  SLP instruction and feedback;Cueing hierarchy;Cognitive reorganization;Compensatory strategies;Functional tasks;Patient/family education;Internal/external aids    Potential to Achieve Goals  Good    Potential Considerations  Family/community  support;Previous level of function;Cooperation/participation level    SLP Home Exercise Plan  divergent naming task assigned    Consulted and Agree with Plan of Care  Patient       Patient will benefit from skilled therapeutic intervention in order to improve the following deficits and impairments:   Cognitive communication deficit    Problem List Patient Active Problem List   Diagnosis Date Noted  . 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 [place], [place], [address] Phone: [phone]   Fax:  [phone]   Name: Erika Phillips MRN: [id number] Date of Birth: 1940/06/07

## 2019-04-09 ENCOUNTER — Encounter: Payer: Self-pay | Admitting: Speech Pathology

## 2019-04-09 ENCOUNTER — Ambulatory Visit: Payer: Medicare Other | Admitting: Speech Pathology

## 2019-04-09 ENCOUNTER — Other Ambulatory Visit: Payer: Self-pay

## 2019-04-09 ENCOUNTER — Encounter: Payer: Medicare Other | Admitting: Speech Pathology

## 2019-04-09 DIAGNOSIS — R41841 Cognitive communication deficit: Secondary | ICD-10-CM

## 2019-04-09 NOTE — Therapy (Signed)
[place] MAIN [facility] SERVICES [address] [place], [place], [address] Phone: [phone]   Fax:  [phone]  Speech Language Pathology Treatment  Patient Details  Name: DASHAY MARCHUK MRN: [id number] Date of Birth: [birth year] No data recorded  Encounter Date: 04/09/2019  End of Session - 04/09/19 1248    Visit Number  3    Number of Visits  17    Date for SLP Re-Evaluation  05/27/19    SLP Start Time  1000    SLP Stop Time   1052    SLP Time Calculation (min)  52 min    Activity Tolerance  Patient tolerated treatment well       Past Medical History:  Diagnosis Date  . Arachnoiditis   . Arthritis   . Foot fracture, right   . Gastritis   . GERD (gastroesophageal reflux disease)   . Hepatic hemangioma    pt denies  . Hyperlipidemia   . Hypertension   . Neuromuscular disorder (HCC)    numbness and tingling bilateral legs  . Reflux esophagitis     Past Surgical History:  Procedure Laterality Date  . CATARACT EXTRACTION     bilateral eyes  . COLONOSCOPY    . DENTAL SURGERY     crown put on  . ESOPHAGOGASTRODUODENOSCOPY    . ESOPHAGOGASTRODUODENOSCOPY (EGD) WITH PROPOFOL N/A 05/07/2016   Procedure: ESOPHAGOGASTRODUODENOSCOPY (EGD) WITH PROPOFOL;  Surgeon: Lollie Sails, MD;  Location: [facility] ENDOSCOPY;  Service: Endoscopy;  Laterality: N/A;  . LUMBAR LAMINECTOMY    . SPINAL CORD STIMULATOR INSERTION N/A 09/09/2015   Procedure: LUMBAR SPINAL CORD STIMULATOR INSERTION;  Surgeon: Erline Levine, MD;  Location: [place] NEURO ORS;  Service: Neurosurgery;  Laterality: N/A;  LUMBAR SPINAL CORD STIMULATOR INSERTION  . TUBAL LIGATION      There were no vitals filed for this visit.  Subjective Assessment - 04/09/19 1005    Subjective  Pt was very pleasant and cooperative with all tx tasks, she joked if she doesn't "write it down, then it never happened!"    Currently in Pain?  Yes    Pain Score  7     Pain Location  Back    Pain Onset   More than a month ago   chronic pain   Pain Frequency  Constant    Pain Relieving Factors  nothing            ADULT SLP TREATMENT - 04/09/19 0001      General Information   Behavior/Cognition  Alert;Pleasant mood;Cooperative    HPI  This very pleasant pt of [name] is here seeking assistance with increasing c/o of word finding. She stated she has noticed an increasing problem with finding her words when she's talking and an increased difficulty remembering names. She said she has also noticed changes in her memory since 2019. CT head without contrast on 11/28/2018 revealed no acute infarct, no mass or hemorrhage. Noted age related volume loss. Family notes she loses her train of thought during conversation.  No hx of head trauma or family hx of dementia. Pt denies issues with driving, managing finances. Stated she is able to cook and clean without mistakes. She has noticed some changes in her balance.      Treatment Provided   Treatment provided  Cognitive-Linquistic      Cognitive-Linquistic Treatment   Treatment focused on  Cognition;Aphasia;Patient/family/caregiver education    Skilled Treatment   Pt completed divided visual attention tasks with  90% accuracy given min to mod verbal cues. Pt named category members given named category with 80 % accuracy given mod verbal cues, 50% independently. Pt named compensatory strategies to aid short term recall given min to mod cues with 100% accuracy.  Pt answered wh questions re: short 2 sentence "stories" with 80% accuracy given regular cues for repetition, 60% accuracy independently.       Assessment / Recommendations / Plan   Plan  Continue with current plan of care      Progression Toward Goals   Progression toward goals  Progressing toward goals       SLP Education - 04/09/19 1248    Education Details  re: compensatory strategies for word finding, short term recall    Person(s) Educated  Patient    Methods   Explanation;Demonstration;Verbal cues    Comprehension  Verbalized understanding;Need further instruction;Returned demonstration;Verbal cues required         SLP Long Term Goals - 04/06/19 1609      SLP LONG TERM GOAL #1   Title  Pt will use external memory aids & compensatory strategies to recall routine & personal information, and recent events with 80% accuracy.    Time  8    Period  Weeks    Status  On-going      SLP LONG TERM GOAL #2   Title  Pt will complete auditory and visual attention/vigilance/memory tasks with 80% accuracy given min cues.    Time  8    Period  Weeks    Status  On-going      SLP LONG TERM GOAL #3   Title  Pt will recall facts, answer wh-questions following reading short paragraphs/functional reading materials [employer], receipts, advertisement, bills, etc) with 80% accuracy.    Time  8    Period  Weeks    Status  On-going      SLP LONG TERM GOAL #4   Title  Pt will give at least 3 semantic descriptors when experiencing word finding difficulty with 100% accuracy.    Time  8    Period  Weeks    Status  On-going       Plan - 04/09/19 1249    Clinical Impression Statement Pt demonstrated improved attention and short term recall given regular cues for repetition and visualization. Pt will continue to benefit from skilled ST tx for cognitive training to restore and provide pt education re: compensatory strategies to improve pt safety and functional independence.   Speech Therapy Frequency  2x / week    Duration  Other (comment)   8 weeks   Treatment/Interventions  SLP instruction and feedback;Cueing hierarchy;Cognitive reorganization;Compensatory strategies;Functional tasks;Patient/family education;Internal/external aids    Potential to Achieve Goals  Good    Potential Considerations  Family/community support;Previous level of function;Cooperation/participation level    SLP Home Exercise Plan  divergent naming task and cognitive flexibility task assigned     Consulted and Agree with Plan of Care  Patient       Patient will benefit from skilled therapeutic intervention in order to improve the following deficits and impairments:   Cognitive communication deficit    Problem List Patient Active Problem List   Diagnosis Date Noted  . Post laminectomy syndrome 09/09/2015    Rulon Eisenmenger, MA, CCC-SLP 04/09/2019, 12:59 PM  [place] MAIN [facility] SERVICES [address] [place], [place], [address] Phone: [phone]   Fax:  [phone]   Name: DAVINITY ROMINE MRN: [id number] Date  of Birth: 17-Sep-1940

## 2019-04-13 ENCOUNTER — Ambulatory Visit: Payer: Medicare Other | Attending: Neurology | Admitting: Speech Pathology

## 2019-04-13 ENCOUNTER — Other Ambulatory Visit: Payer: Self-pay

## 2019-04-13 ENCOUNTER — Encounter: Payer: Self-pay | Admitting: Speech Pathology

## 2019-04-13 DIAGNOSIS — R41841 Cognitive communication deficit: Secondary | ICD-10-CM | POA: Diagnosis not present

## 2019-04-13 NOTE — Therapy (Signed)
[place] MAIN [facility] SERVICES [address] [place], [place], [address] Phone: [phone]   Fax:  [phone]  Speech Language Pathology Treatment  Patient Details  Name: Erika Phillips MRN: [id number] Date of Birth: 04-26-1940 No data recorded  Encounter Date: 04/13/2019  End of Session - 04/13/19 1417    Visit Number  4    Number of Visits  17    Date for SLP Re-Evaluation  05/27/19    Authorization - Visit Number  3    Authorization - Number of Visits  10    SLP Start Time  [id number]   pt arrived 13 min late to tx   SLP Stop Time   1401    SLP Time Calculation (min)  47 min    Activity Tolerance  Patient tolerated treatment well       Past Medical History:  Diagnosis Date  . Arachnoiditis   . Arthritis   . Foot fracture, right   . Gastritis   . GERD (gastroesophageal reflux disease)   . Hepatic hemangioma    pt denies  . Hyperlipidemia   . Hypertension   . Neuromuscular disorder (HCC)    numbness and tingling bilateral legs  . Reflux esophagitis     Past Surgical History:  Procedure Laterality Date  . CATARACT EXTRACTION     bilateral eyes  . COLONOSCOPY    . DENTAL SURGERY     crown put on  . ESOPHAGOGASTRODUODENOSCOPY    . ESOPHAGOGASTRODUODENOSCOPY (EGD) WITH PROPOFOL N/A 05/07/2016   Procedure: ESOPHAGOGASTRODUODENOSCOPY (EGD) WITH PROPOFOL;  Surgeon: Lollie Sails, MD;  Location: [facility] ENDOSCOPY;  Service: Endoscopy;  Laterality: N/A;  . LUMBAR LAMINECTOMY    . SPINAL CORD STIMULATOR INSERTION N/A 09/09/2015   Procedure: LUMBAR SPINAL CORD STIMULATOR INSERTION;  Surgeon: Erline Levine, MD;  Location: [place] NEURO ORS;  Service: Neurosurgery;  Laterality: N/A;  LUMBAR SPINAL CORD STIMULATOR INSERTION  . TUBAL LIGATION      There were no vitals filed for this visit.  Subjective Assessment - 04/13/19 1415    Subjective  Pt reported she had a quiet but good weekend. She brought her homework folder, crossword puzzle book  and neuroplasticity book with her to tx today.    Currently in Pain?  Yes    Pain Score  6     Pain Location  Back    Pain Type  Chronic pain            ADULT SLP TREATMENT - 04/13/19 0001      General Information   Behavior/Cognition  Alert;Pleasant mood;Cooperative    HPI  This very pleasant pt of Erika Phillips is here seeking assistance with increasing c/o of word finding. She stated she has noticed an increasing problem with finding her words when she's talking and an increased difficulty remembering names. She said she has also noticed changes in her memory since 2019. CT head without contrast on 11/28/2018 revealed no acute infarct, no mass or hemorrhage. Noted age related volume loss. Family notes she loses her train of thought during conversation.  No hx of head trauma or family hx of dementia. Pt denies issues with driving, managing finances. Stated she is able to cook and clean without mistakes. She has noticed some changes in her balance.      Treatment Provided   Treatment provided  Cognitive-Linquistic      Cognitive-Linquistic Treatment   Treatment focused on  Cognition;Aphasia;Patient/family/caregiver education    Skilled Treatment  Pt completed visual attention and vigilance tasks (including counting change) with 100% accuracy given semi-regular moderate verbal cues, 40% accuracy independently. Pt recalled 3 related items following 2-3 minute interval given regular cues for repetition and visualization with 80% accuracy.       Assessment / Recommendations / Plan   Plan  Continue with current plan of care      Progression Toward Goals   Progression toward goals  Progressing toward goals       SLP Education - 04/13/19 1417    Education Details  re: compensatory strategies for word finding, short term recall    Person(s) Educated  Patient    Methods  Explanation;Demonstration;Verbal cues;Handout    Comprehension  Verbalized understanding;Need further  instruction;Returned demonstration;Verbal cues required         SLP Long Term Goals - 04/06/19 1609      SLP LONG TERM GOAL #1   Title  Pt will use external memory aids & compensatory strategies to recall routine & personal information, and recent events with 80% accuracy.    Time  8    Period  Weeks    Status  On-going      SLP LONG TERM GOAL #2   Title  Pt will complete auditory and visual attention/vigilance/memory tasks with 80% accuracy given min cues.    Time  8    Period  Weeks    Status  On-going      SLP LONG TERM GOAL #3   Title  Pt will recall facts, answer wh-questions following reading short paragraphs/functional reading materials [employer], receipts, advertisement, bills, etc) with 80% accuracy.    Time  8    Period  Weeks    Status  On-going      SLP LONG TERM GOAL #4   Title  Pt will give at least 3 semantic descriptors when experiencing word finding difficulty with 100% accuracy.    Time  8    Period  Weeks    Status  On-going       Plan - 04/13/19 1418    Clinical Impression Statement  Cues for repetition and visualization proved most effective improving accuracy with short term recall tasks. Noted pt remembered to bring in requested materials from home, large print crossword puzzles, neuroplasticity book given to her by a friend, and all her completed homework assignments! Pt is dedicated to improving her cognitive linguistic skills. Pt will continue to benefit from skilled ST tx for cognitive training to restore and provide pt education re: compensatory strategies to improve pt safety and functional independence.   Speech Therapy Frequency  2x / week    Duration  Other (comment)   8 weeks   Treatment/Interventions  SLP instruction and feedback;Cueing hierarchy;Cognitive reorganization;Compensatory strategies;Functional tasks;Patient/family education;Internal/external aids    Potential to Achieve Goals  Good    Potential Considerations  Family/community  support;Previous level of function;Cooperation/participation level    SLP Home Exercise Plan  divergent naming task and cognitive flexibility task assigned    Consulted and Agree with Plan of Care  Patient       Patient will benefit from skilled therapeutic intervention in order to improve the following deficits and impairments:   Cognitive communication deficit    Problem List Patient Active Problem List   Diagnosis Date Noted  . Post laminectomy syndrome 09/09/2015    Rulon Eisenmenger, MA, CCC-SLP 04/13/2019, 2:36 PM  [place] MAIN [facility] SERVICES [address] [place], [place], [address] Phone:  [phone]   Fax:  [phone]   Name: ARIFA ROMACK MRN: [id number] Date of Birth: 04-06-1941

## 2019-04-16 ENCOUNTER — Encounter: Payer: Medicare Other | Admitting: Speech Pathology

## 2019-04-20 ENCOUNTER — Ambulatory Visit: Payer: Medicare Other | Admitting: Speech Pathology

## 2019-04-20 ENCOUNTER — Encounter: Payer: Self-pay | Admitting: Speech Pathology

## 2019-04-20 ENCOUNTER — Other Ambulatory Visit: Payer: Self-pay

## 2019-04-20 DIAGNOSIS — R41841 Cognitive communication deficit: Secondary | ICD-10-CM | POA: Diagnosis not present

## 2019-04-20 NOTE — Therapy (Signed)
[place] MAIN [facility] SERVICES [address] [place], [place], [address] Phone: [phone]   Fax:  [phone]  Speech Language Pathology Treatment  Patient Details  Name: Erika Phillips MRN: [id number] Date of Birth: 06-11-1940 No data recorded  Encounter Date: 04/20/2019  End of Session - 04/20/19 1110    Visit Number  5    Number of Visits  17    Date for SLP Re-Evaluation  05/27/19    Authorization - Visit Number  4    Authorization - Number of Visits  10    SLP Start Time  1000    SLP Stop Time   [id number]    SLP Time Calculation (min)  56 min    Activity Tolerance  Patient tolerated treatment well       Past Medical History:  Diagnosis Date  . Arachnoiditis   . Arthritis   . Foot fracture, right   . Gastritis   . GERD (gastroesophageal reflux disease)   . Hepatic hemangioma    pt denies  . Hyperlipidemia   . Hypertension   . Neuromuscular disorder (HCC)    numbness and tingling bilateral legs  . Reflux esophagitis     Past Surgical History:  Procedure Laterality Date  . CATARACT EXTRACTION     bilateral eyes  . COLONOSCOPY    . DENTAL SURGERY     crown put on  . ESOPHAGOGASTRODUODENOSCOPY    . ESOPHAGOGASTRODUODENOSCOPY (EGD) WITH PROPOFOL N/A 05/07/2016   Procedure: ESOPHAGOGASTRODUODENOSCOPY (EGD) WITH PROPOFOL;  Surgeon: Lollie Sails, MD;  Location: [facility] ENDOSCOPY;  Service: Endoscopy;  Laterality: N/A;  . LUMBAR LAMINECTOMY    . SPINAL CORD STIMULATOR INSERTION N/A 09/09/2015   Procedure: LUMBAR SPINAL CORD STIMULATOR INSERTION;  Surgeon: Erline Levine, MD;  Location: [place] NEURO ORS;  Service: Neurosurgery;  Laterality: N/A;  LUMBAR SPINAL CORD STIMULATOR INSERTION  . TUBAL LIGATION      There were no vitals filed for this visit.  Subjective Assessment - 04/20/19 1108    Subjective  Pt reported she had a good weekend. She remembered to bring her homework folder and recipe she was telling SLP about last session.     Currently in Pain?  Yes    Pain Score  6     Pain Location  Back    Pain Type  Chronic pain    Pain Frequency  Constant    Pain Relieving Factors  nothing            ADULT SLP TREATMENT - 04/20/19 0001      General Information   Behavior/Cognition  Alert;Pleasant mood;Cooperative    HPI  This very pleasant pt of [name] is here seeking assistance with increasing c/o of word finding. She stated she has noticed an increasing problem with finding her words when she's talking and an increased difficulty remembering names. She said she has also noticed changes in her memory since 2019. CT head without contrast on 11/28/2018 revealed no acute infarct, no mass or hemorrhage. Noted age related volume loss. Family notes she loses her train of thought during conversation.  No hx of head trauma or family hx of dementia. Pt denies issues with driving, managing finances. Stated she is able to cook and clean without mistakes. She has noticed some changes in her balance.      Treatment Provided   Treatment provided  Cognitive-Linquistic      Pain Assessment   Pain Assessment  0-10  Pain Score  6     Pain Location  Back    Pain Intervention(s)  Monitored during session      Cognitive-Linquistic Treatment   Treatment focused on  Cognition;Aphasia;Patient/family/caregiver education    Skilled Treatment   Pt named category members given named category with 80 % accuracy given min to mod verbal cues, 65% independently.  Pt named 3-4 semantic descriptors given pictured objects given written and min to mod verbal cues with 100% accuracy.        Assessment / Recommendations / Plan   Plan  Continue with current plan of care      Progression Toward Goals   Progression toward goals  Progressing toward goals       SLP Education - 04/20/19 1110    Education Details  re: compensatory strategies for word finding, short term recall    Person(s) Educated  Patient    Methods   Explanation;Demonstration;Verbal cues    Comprehension  Verbalized understanding;Need further instruction;Returned demonstration;Verbal cues required         SLP Long Term Goals - 04/06/19 1609      SLP LONG TERM GOAL #1   Title  Pt will use external memory aids & compensatory strategies to recall routine & personal information, and recent events with 80% accuracy.    Time  8    Period  Weeks    Status  On-going      SLP LONG TERM GOAL #2   Title  Pt will complete auditory and visual attention/vigilance/memory tasks with 80% accuracy given min cues.    Time  8    Period  Weeks    Status  On-going      SLP LONG TERM GOAL #3   Title  Pt will recall facts, answer wh-questions following reading short paragraphs/functional reading materials [employer], receipts, advertisement, bills, etc) with 80% accuracy.    Time  8    Period  Weeks    Status  On-going      SLP LONG TERM GOAL #4   Title  Pt will give at least 3 semantic descriptors when experiencing word finding difficulty with 100% accuracy.    Time  8    Period  Weeks    Status  On-going       Plan - 04/20/19 1111    Clinical Impression Statement  Pt noted improved word finding when she began to describe target items. Written cues (SFA chart) as well as verbal cues required to stimulate retrieval of descriptive details, without cues pt attempts to describe using vague low information content phrases. Home practice materials provided. Pt will continue to benefit from skilled ST tx for cognitive training to restore and provide pt education re: compensatory strategies to improve pt safety and functional independence.   Speech Therapy Frequency  2x / week    Duration  Other (comment)   8 weeks   Treatment/Interventions  SLP instruction and feedback;Cueing hierarchy;Cognitive reorganization;Compensatory strategies;Functional tasks;Patient/family education;Internal/external aids    Potential to Achieve Goals  Good    Potential  Considerations  Family/community support;Previous level of function;Cooperation/participation level    SLP Home Exercise Plan  divergent naming task and cognitive flexibility task assigned    Consulted and Agree with Plan of Care  Patient       Patient will benefit from skilled therapeutic intervention in order to improve the following deficits and impairments:   Cognitive communication deficit    Problem List Patient Active Problem List   Diagnosis  Date Noted  . Post laminectomy syndrome 09/09/2015    Rulon Eisenmenger, MA, CCC-SLP 04/20/2019, 11:21 AM  [place] MAIN [facility] SERVICES [address] [place], [place], [address] Phone: [phone]   Fax:  [phone]   Name: DAYLIA HARTIN MRN: [id number] Date of Birth: Sep 15, 1940

## 2019-04-23 ENCOUNTER — Encounter: Payer: Medicare Other | Admitting: Speech Pathology

## 2019-04-27 ENCOUNTER — Encounter: Payer: Self-pay | Admitting: Speech Pathology

## 2019-04-27 ENCOUNTER — Other Ambulatory Visit: Payer: Self-pay

## 2019-04-27 ENCOUNTER — Ambulatory Visit: Payer: Medicare Other | Admitting: Speech Pathology

## 2019-04-27 DIAGNOSIS — R41841 Cognitive communication deficit: Secondary | ICD-10-CM | POA: Diagnosis not present

## 2019-04-27 NOTE — Therapy (Signed)
[place] MAIN [facility] SERVICES [address] [place], [place], [address] Phone: [phone]   Fax:  [phone]  Speech Language Pathology Treatment  Patient Details  Name: Erika Phillips MRN: [id number] Date of Birth: [birth year] No data recorded  Encounter Date: 04/27/2019  End of Session - 04/27/19 1417    Visit Number  6    Number of Visits  17    Date for SLP Re-Evaluation  05/27/19    Authorization - Visit Number  5    Authorization - Number of Visits  10    SLP Start Time  [id number]   pt arrived late to tx   SLP Stop Time   1401    SLP Time Calculation (min)  52 min    Activity Tolerance  Patient tolerated treatment well       Past Medical History:  Diagnosis Date  . Arachnoiditis   . Arthritis   . Foot fracture, right   . Gastritis   . GERD (gastroesophageal reflux disease)   . Hepatic hemangioma    pt denies  . Hyperlipidemia   . Hypertension   . Neuromuscular disorder (HCC)    numbness and tingling bilateral legs  . Reflux esophagitis     Past Surgical History:  Procedure Laterality Date  . CATARACT EXTRACTION     bilateral eyes  . COLONOSCOPY    . DENTAL SURGERY     crown put on  . ESOPHAGOGASTRODUODENOSCOPY    . ESOPHAGOGASTRODUODENOSCOPY (EGD) WITH PROPOFOL N/A 05/07/2016   Procedure: ESOPHAGOGASTRODUODENOSCOPY (EGD) WITH PROPOFOL;  Surgeon: Lollie Sails, MD;  Location: [facility] ENDOSCOPY;  Service: Endoscopy;  Laterality: N/A;  . LUMBAR LAMINECTOMY    . SPINAL CORD STIMULATOR INSERTION N/A 09/09/2015   Procedure: LUMBAR SPINAL CORD STIMULATOR INSERTION;  Surgeon: Erline Levine, MD;  Location: [facility] NEURO ORS;  Service: Neurosurgery;  Laterality: N/A;  LUMBAR SPINAL CORD STIMULATOR INSERTION  . TUBAL LIGATION      There were no vitals filed for this visit.  Subjective Assessment - 04/27/19 1321    Currently in Pain?  Yes    Pain Score  6     Pain Location  Back    Pain Onset  More than a month ago    Pain  Frequency  Constant    Pain Relieving Factors  nothing            ADULT SLP TREATMENT - 04/27/19 0001      General Information   Behavior/Cognition  Alert;Pleasant mood;Cooperative    HPI  This very pleasant pt of [name] is here seeking assistance with increasing c/o of word finding. She stated she has noticed an increasing problem with finding her words when she's talking and an increased difficulty remembering names. She said she has also noticed changes in her memory since 2019. CT head without contrast on 11/28/2018 revealed no acute infarct, no mass or hemorrhage. Noted age related volume loss. Family notes she loses her train of thought during conversation.  No hx of head trauma or family hx of dementia. Pt denies issues with driving, managing finances. Stated she is able to cook and clean without mistakes. She has noticed some changes in her balance.      Treatment Provided   Treatment provided  Cognitive-Linquistic      Pain Assessment   Pain Assessment  0-10    Pain Score  6     Pain Location  Back    Pain Intervention(s)  Monitored during session      Cognitive-Linquistic Treatment   Treatment focused on  Cognition;Patient/family/caregiver education    Skilled Treatment  Pt named category given category members with 80 % accuracy given mod verbal cues, 60% independently. Pt named 3-4 semantic descriptors given pictured objects given written and min to mod verbal cues with 85% accuracy.  Educated pt on practical use of compensatory strategies to aid short term recall and word finding at length. Also discussed need to let her husband know she doesn't want him to "help" her by finishing her sentences, and ways he can help facilitate when she does have word finding difficulties.       Assessment / Recommendations / Plan   Plan  Continue with current plan of care      Progression Toward Goals   Progression toward goals  Progressing toward goals       SLP Education -  04/27/19 1416    Education Details  re: compensatory strategies for word finding, short term recall    Person(s) Educated  Patient    Methods  Explanation;Demonstration;Verbal cues    Comprehension  Verbalized understanding;Returned demonstration;Verbal cues required;Need further instruction         SLP Long Term Goals - 04/06/19 [place] #1   Title  Pt will use external memory aids & compensatory strategies to recall routine & personal information, and recent events with 80% accuracy.    Time  8    Period  Weeks    Status  On-going      SLP LONG TERM GOAL #2   Title  Pt will complete auditory and visual attention/vigilance/memory tasks with 80% accuracy given min cues.    Time  8    Period  Weeks    Status  On-going      SLP LONG TERM GOAL #3   Title  Pt will recall facts, answer wh-questions following reading short paragraphs/functional reading materials [employer], receipts, advertisement, bills, etc) with 80% accuracy.    Time  8    Period  Weeks    Status  On-going      SLP LONG TERM GOAL #4   Title  Pt will give at least 3 semantic descriptors when experiencing word finding difficulty with 100% accuracy.    Time  8    Period  Weeks    Status  On-going       Plan - 04/27/19 1418    Clinical Impression Statement  Noted emerging independence using SFA to aid word finding in conversation. Greater difficulty noted w/convergent vs divergent naming. Discussed reading out loud and taking short notes on reading material to improve storage of salient details. Pt will continue to benefit from skilled ST tx for cognitive training to restore and provide pt education re: compensatory strategies to improve pt safety and functional independence.   Speech Therapy Frequency  2x / week    Duration  Other (comment)   8 weeks   Treatment/Interventions  SLP instruction and feedback;Cueing hierarchy;Cognitive reorganization;Compensatory strategies;Functional  tasks;Patient/family education;Internal/external aids    Potential to Achieve Goals  Good    Potential Considerations  Family/community support;Previous level of function;Cooperation/participation level    SLP Home Exercise Plan  divergent naming and short term recall tasks assigned    Consulted and Agree with Plan of Care  Patient       Patient will benefit from skilled therapeutic intervention in order to improve the following deficits and  impairments:   Cognitive communication deficit    Problem List Patient Active Problem List   Diagnosis Date Noted  . Post laminectomy syndrome 09/09/2015     Rulon Eisenmenger, MA, CCC-SLP 04/27/2019, 2:29 PM  [place] MAIN [facility] SERVICES [address] [place], [place], [address] Phone: [phone]   Fax:  [phone]   Name: OLLA SERVISS MRN: [id number] Date of Birth: [birth year]

## 2019-04-30 ENCOUNTER — Ambulatory Visit: Payer: Medicare Other | Admitting: Speech Pathology

## 2019-05-04 ENCOUNTER — Other Ambulatory Visit: Payer: Self-pay

## 2019-05-04 ENCOUNTER — Encounter: Payer: Self-pay | Admitting: Speech Pathology

## 2019-05-04 ENCOUNTER — Ambulatory Visit: Payer: Medicare Other | Admitting: Speech Pathology

## 2019-05-04 DIAGNOSIS — R41841 Cognitive communication deficit: Secondary | ICD-10-CM | POA: Diagnosis not present

## 2019-05-04 NOTE — Therapy (Signed)
[place] MAIN [facility] SERVICES [address] [place], [place], [address] Phone: [phone]   Fax:  [phone]  Speech Language Pathology Treatment  Patient Details  Name: Erika Phillips MRN: [id number] Date of Birth: 1940/09/09 No data recorded  Encounter Date: 05/04/2019  End of Session - 05/04/19 1535    Visit Number  7    Number of Visits  17    Date for SLP Re-Evaluation  05/27/19    Authorization - Visit Number  6    Authorization - Number of Visits  10    SLP Start Time  1301    SLP Stop Time   1400    SLP Time Calculation (min)  59 min    Activity Tolerance  Patient tolerated treatment well       Past Medical History:  Diagnosis Date  . Arachnoiditis   . Arthritis   . Foot fracture, right   . Gastritis   . GERD (gastroesophageal reflux disease)   . Hepatic hemangioma    pt denies  . Hyperlipidemia   . Hypertension   . Neuromuscular disorder (HCC)    numbness and tingling bilateral legs  . Reflux esophagitis     Past Surgical History:  Procedure Laterality Date  . CATARACT EXTRACTION     bilateral eyes  . COLONOSCOPY    . DENTAL SURGERY     crown put on  . ESOPHAGOGASTRODUODENOSCOPY    . ESOPHAGOGASTRODUODENOSCOPY (EGD) WITH PROPOFOL N/A 05/07/2016   Procedure: ESOPHAGOGASTRODUODENOSCOPY (EGD) WITH PROPOFOL;  Surgeon: Lollie Sails, MD;  Location: [facility] ENDOSCOPY;  Service: Endoscopy;  Laterality: N/A;  . LUMBAR LAMINECTOMY    . SPINAL CORD STIMULATOR INSERTION N/A 09/09/2015   Procedure: LUMBAR SPINAL CORD STIMULATOR INSERTION;  Surgeon: Erline Levine, MD;  Location: [place] NEURO ORS;  Service: Neurosurgery;  Laterality: N/A;  LUMBAR SPINAL CORD STIMULATOR INSERTION  . TUBAL LIGATION      There were no vitals filed for this visit.  Subjective Assessment - 05/04/19 1305    Subjective  Pt reported she is doing well, has been reading "The Alzheimer's solution" by Dr.'s [place] and has been learning a lot  about the brain and healthy choices she can make to support her memory.    Currently in Pain?  Yes    Pain Score  7     Pain Location  Back    Pain Onset  More than a month ago            ADULT SLP TREATMENT - 05/04/19 0001      General Information   Behavior/Cognition  Alert;Pleasant mood;Cooperative    HPI  This very pleasant pt of [name] is here seeking assistance with increasing c/o of word finding. She stated she has noticed an increasing problem with finding her words when she's talking and an increased difficulty remembering names. She said she has also noticed changes in her memory since 2019. CT head without contrast on 11/28/2018 revealed no acute infarct, no mass or hemorrhage. Noted age related volume loss. Family notes she loses her train of thought during conversation.  No hx of head trauma or family hx of dementia. Pt denies issues with driving, managing finances. Stated she is able to cook and clean without mistakes. She has noticed some changes in her balance.      Treatment Provided   Treatment provided  Cognitive-Linquistic      Pain Assessment   Pain Assessment  0-10  Pain Score  7     Pain Location  Back    Pain Intervention(s)  Monitored during session      Cognitive-Linquistic Treatment   Treatment focused on  Cognition;Patient/family/caregiver education    Skilled Treatment   Pt completed divided visual attention tasks with 90% accuracy given min to mod verbal cues, 60% accuracy independently. Pt named compensatory strategies to aid short term recall given min to mod cues with 100% accuracy. Pt performed working memory & cognitive flexibility tasks given mod verbal cues for strategies with 80% accuracy. Pt performed visual memory tasks given min to mod verbal cues for strategies with 100% accuracy.       Assessment / Recommendations / Plan   Plan  Continue with current plan of care      Progression Toward Goals   Progression toward goals   Progressing toward goals       SLP Education - 05/04/19 1534    Education Details  re: compensatory strategies for word finding, short term recall    Person(s) Educated  Patient    Methods  Demonstration;Explanation;Verbal cues;Handout    Comprehension  Verbalized understanding;Need further instruction;Returned demonstration;Verbal cues required         SLP Long Term Goals - 04/06/19 1609      SLP LONG TERM GOAL #1   Title  Pt will use external memory aids & compensatory strategies to recall routine & personal information, and recent events with 80% accuracy.    Time  8    Period  Weeks    Status  On-going      SLP LONG TERM GOAL #2   Title  Pt will complete auditory and visual attention/vigilance/memory tasks with 80% accuracy given min cues.    Time  8    Period  Weeks    Status  On-going      SLP LONG TERM GOAL #3   Title  Pt will recall facts, answer wh-questions following reading short paragraphs/functional reading materials [employer], receipts, advertisement, bills, etc) with 80% accuracy.    Time  8    Period  Weeks    Status  On-going      SLP LONG TERM GOAL #4   Title  Pt will give at least 3 semantic descriptors when experiencing word finding difficulty with 100% accuracy.    Time  8    Period  Weeks    Status  On-going       Plan - 05/04/19 1535    Clinical Impression Statement Pt required regular verbal cues for strategies to complete working memory tasks with accuracy. Pt completed all assigned home practice materials with consistency.  New materials provided. Emerging independence noted identifying compensatory strategies to aid short term recall. Pt will continue to benefit from skilled ST tx for cognitive training to restore and provide pt education re: compensatory strategies to improve pt safety and functional independence.   Speech Therapy Frequency  2x / week    Duration  Other (comment)   8 weeks   Treatment/Interventions  SLP instruction and  feedback;Cueing hierarchy;Cognitive reorganization;Compensatory strategies;Functional tasks;Patient/family education;Internal/external aids    Potential to Achieve Goals  Good    Potential Considerations  Family/community support;Previous level of function;Cooperation/participation level    SLP Home Exercise Plan  divergent naming and short term recall tasks assigned    Consulted and Agree with Plan of Care  Patient       Patient will benefit from skilled therapeutic intervention in order to improve the  following deficits and impairments:   Cognitive communication deficit    Problem List Patient Active Problem List   Diagnosis Date Noted  . Post laminectomy syndrome 09/09/2015    Rulon Eisenmenger, MA, CCC-SLP 05/04/2019, 3:54 PM  [place] MAIN [facility] SERVICES [address] [place], [place], [address] Phone: [phone]   Fax:  [phone]   Name: Erika Phillips MRN: [id number] Date of Birth: 05-19-1940

## 2019-05-07 ENCOUNTER — Encounter: Payer: Self-pay | Admitting: Speech Pathology

## 2019-05-07 ENCOUNTER — Ambulatory Visit: Payer: Medicare Other | Admitting: Speech Pathology

## 2019-05-07 ENCOUNTER — Other Ambulatory Visit: Payer: Self-pay

## 2019-05-07 DIAGNOSIS — R41841 Cognitive communication deficit: Secondary | ICD-10-CM

## 2019-05-07 NOTE — Therapy (Signed)
[place] MAIN [facility] SERVICES [address] [place], [place], [address] Phone: [phone]   Fax:  [phone]  Speech Language Pathology Treatment  Patient Details  Name: Erika Phillips MRN: [id number] Date of Birth: [birth year] No data recorded  Encounter Date: 05/07/2019  End of Session - 05/07/19 1411    Visit Number  8    Number of Visits  17    Date for SLP Re-Evaluation  05/27/19    Authorization - Visit Number  7    Authorization - Number of Visits  10    SLP Start Time  [id number]    SLP Stop Time   1403    SLP Time Calculation (min)  56 min    Activity Tolerance  Patient tolerated treatment well       Past Medical History:  Diagnosis Date  . Arachnoiditis   . Arthritis   . Foot fracture, right   . Gastritis   . GERD (gastroesophageal reflux disease)   . Hepatic hemangioma    pt denies  . Hyperlipidemia   . Hypertension   . Neuromuscular disorder (HCC)    numbness and tingling bilateral legs  . Reflux esophagitis     Past Surgical History:  Procedure Laterality Date  . CATARACT EXTRACTION     bilateral eyes  . COLONOSCOPY    . DENTAL SURGERY     crown put on  . ESOPHAGOGASTRODUODENOSCOPY    . ESOPHAGOGASTRODUODENOSCOPY (EGD) WITH PROPOFOL N/A 05/07/2016   Procedure: ESOPHAGOGASTRODUODENOSCOPY (EGD) WITH PROPOFOL;  Surgeon: Lollie Sails, MD;  Location: [facility] ENDOSCOPY;  Service: Endoscopy;  Laterality: N/A;  . LUMBAR LAMINECTOMY    . SPINAL CORD STIMULATOR INSERTION N/A 09/09/2015   Procedure: LUMBAR SPINAL CORD STIMULATOR INSERTION;  Surgeon: Erline Levine, MD;  Location: [place] NEURO ORS;  Service: Neurosurgery;  Laterality: N/A;  LUMBAR SPINAL CORD STIMULATOR INSERTION  . TUBAL LIGATION      There were no vitals filed for this visit.  Subjective Assessment - 05/07/19 1409    Subjective  Pt reported she is doing well today, but that it is extremely cold outside. She said she enjoyed running into an old MD friend, Dr.  Precious Reel.            ADULT SLP TREATMENT - 05/07/19 0001      General Information   Behavior/Cognition  Alert;Pleasant mood;Cooperative    HPI  This very pleasant pt of [name] is here seeking assistance with increasing c/o of word finding. She stated she has noticed an increasing problem with finding her words when she's talking and an increased difficulty remembering names. She said she has also noticed changes in her memory since 2019. CT head without contrast on 11/28/2018 revealed no acute infarct, no mass or hemorrhage. Noted age related volume loss. Family notes she loses her train of thought during conversation.  No hx of head trauma or family hx of dementia. Pt denies issues with driving, managing finances. Stated she is able to cook and clean without mistakes. She has noticed some changes in her balance.      Treatment Provided   Treatment provided  Cognitive-Linquistic      Pain Assessment   Pain Assessment  0-10    Pain Score  6     Pain Location  Back    Pain Intervention(s)  Monitored during session      Cognitive-Linquistic Treatment   Treatment focused on  Cognition;Patient/family/caregiver education    Skilled Treatment  Pt completed convergent naming tasks given min verbal cues with 90% accuracy, 50% accuracy independently.  Pt completed divided visual attention tasks with 80% accuracy given min to mod verbal cues.  Patient performed working memory and cognitive flexibility tasks given semi-regular mod verbal and visual cues with 90% accuracy.        Assessment / Recommendations / Plan   Plan  Continue with current plan of care      Progression Toward Goals   Progression toward goals  Progressing toward goals       SLP Education - 05/07/19 1410    Education Details  re: compensatory strategies for word finding, short term recall    Person(s) Educated  Patient    Methods  Explanation;Demonstration;Verbal cues    Comprehension  Verbalized  understanding;Need further instruction;Returned demonstration;Verbal cues required         SLP Long Term Goals - 04/06/19 1609      SLP LONG TERM GOAL #1   Title  Pt will use external memory aids & compensatory strategies to recall routine & personal information, and recent events with 80% accuracy.    Time  8    Period  Weeks    Status  On-going      SLP LONG TERM GOAL #2   Title  Pt will complete auditory and visual attention/vigilance/memory tasks with 80% accuracy given min cues.    Time  8    Period  Weeks    Status  On-going      SLP LONG TERM GOAL #3   Title  Pt will recall facts, answer wh-questions following reading short paragraphs/functional reading materials [employer], receipts, advertisement, bills, etc) with 80% accuracy.    Time  8    Period  Weeks    Status  On-going      SLP LONG TERM GOAL #4   Title  Pt will give at least 3 semantic descriptors when experiencing word finding difficulty with 100% accuracy.    Time  8    Period  Weeks    Status  On-going       Plan - 05/07/19 1411    Clinical Impression Statement  Independence and accuracy w/divided attention tasks notably increased as trials progressed requiring a decreased level of cueing. Increased difficulty w/convergent naming noted this session.  Home practice materials provided. Pt will continue to benefit from skilled ST tx for cognitive training to restore and provide pt education re: compensatory strategies to improve pt safety and functional independence.   Speech Therapy Frequency  2x / week    Duration  Other (comment)   8 weeks   Treatment/Interventions  SLP instruction and feedback;Cueing hierarchy;Cognitive reorganization;Compensatory strategies;Functional tasks;Patient/family education;Internal/external aids    Potential to Achieve Goals  Good    Potential Considerations  Family/community support;Previous level of function;Cooperation/participation level    SLP Home Exercise Plan  convergent and  divergent naming, sustained attention & short term recall tasks assigned    Consulted and Agree with Plan of Care  Patient       Patient will benefit from skilled therapeutic intervention in order to improve the following deficits and impairments:   Cognitive communication deficit    Problem List Patient Active Problem List   Diagnosis Date Noted  . Post laminectomy syndrome 09/09/2015    Rulon Eisenmenger, MA, CCC-SLP 05/07/2019, 2:24 PM  [place] MAIN [facility] SERVICES [address] [place], [place], [address] Phone: [phone]   Fax:  [phone]  Name: Erika Phillips MRN: [id number] Date of Birth: [birth year]

## 2019-05-11 ENCOUNTER — Ambulatory Visit: Payer: Medicare Other | Admitting: Speech Pathology

## 2019-05-14 ENCOUNTER — Encounter: Payer: Self-pay | Admitting: Speech Pathology

## 2019-05-14 ENCOUNTER — Other Ambulatory Visit: Payer: Self-pay

## 2019-05-14 ENCOUNTER — Ambulatory Visit: Payer: Medicare Other | Attending: Neurology | Admitting: Speech Pathology

## 2019-05-14 DIAGNOSIS — R41841 Cognitive communication deficit: Secondary | ICD-10-CM | POA: Diagnosis present

## 2019-05-14 NOTE — Therapy (Signed)
[place] MAIN [facility] SERVICES [place], [place], [address] Phone: [phone]   Fax:  [phone]  Speech Language Pathology Treatment  Patient Details  Name: Erika Phillips MRN: [id number] Date of Birth: 01/02/1941 No data recorded  Encounter Date: 05/14/2019  End of Session - 05/14/19 1511    Visit Number  9    Number of Visits  17    Date for SLP Re-Evaluation  05/27/19    Authorization - Visit Number  9    Authorization - Number of Visits  10    SLP Start Time  1400    SLP Stop Time   1455    SLP Time Calculation (min)  55 min    Activity Tolerance  Patient tolerated treatment well       Past Medical History:  Diagnosis Date  . Arachnoiditis   . Arthritis   . Foot fracture, right   . Gastritis   . GERD (gastroesophageal reflux disease)   . Hepatic hemangioma    pt denies  . Hyperlipidemia   . Hypertension   . Neuromuscular disorder (HCC)    numbness and tingling bilateral legs  . Reflux esophagitis     Past Surgical History:  Procedure Laterality Date  . CATARACT EXTRACTION     bilateral eyes  . COLONOSCOPY    . DENTAL SURGERY     crown put on  . ESOPHAGOGASTRODUODENOSCOPY    . ESOPHAGOGASTRODUODENOSCOPY (EGD) WITH PROPOFOL N/A 05/07/2016   Procedure: ESOPHAGOGASTRODUODENOSCOPY (EGD) WITH PROPOFOL;  Surgeon: Lollie Sails, MD;  Location: [facility] ENDOSCOPY;  Service: Endoscopy;  Laterality: N/A;  . LUMBAR LAMINECTOMY    . SPINAL CORD STIMULATOR INSERTION N/A 09/09/2015   Procedure: LUMBAR SPINAL CORD STIMULATOR INSERTION;  Surgeon: Erline Levine, MD;  Location: [place] NEURO ORS;  Service: Neurosurgery;  Laterality: N/A;  LUMBAR SPINAL CORD STIMULATOR INSERTION  . TUBAL LIGATION      There were no vitals filed for this visit.  Subjective Assessment - 05/14/19 1445    Subjective  Pt reported that she is doing well and remembered to bring in all her homework materials. She stated she looking forward to getting  her 2nd vaccine shot tomorrow.    Currently in Pain?  Yes    Pain Score  6     Pain Location  Back    Pain Type  Chronic pain    Pain Onset  More than a month ago    Pain Frequency  Constant    Pain Relieving Factors  nothing            ADULT SLP TREATMENT - 05/14/19 0001      General Information   Behavior/Cognition  Alert;Pleasant mood;Cooperative    HPI  This very pleasant pt of [name] is here seeking assistance with increasing c/o of word finding. She stated she has noticed an increasing problem with finding her words when she's talking and an increased difficulty remembering names. She said she has also noticed changes in her memory since 2019. CT head without contrast on 11/28/2018 revealed no acute infarct, no mass or hemorrhage. Noted age related volume loss. Family notes she loses her train of thought during conversation.  No hx of head trauma or family hx of dementia. Pt denies issues with driving, managing finances. Stated she is able to cook and clean without mistakes. She has noticed some changes in her balance.      Treatment Provided   Treatment provided  Cognitive-Linquistic      Pain Assessment   Pain Assessment  0-10    Pain Score  6     Pain Location  Back    Pain Intervention(s)  Monitored during session      Cognitive-Linquistic Treatment   Treatment focused on  Cognition;Patient/family/caregiver education    Skilled Treatment   Reviewed home assignments, pt answered wh & y/n questions re: 3-5 sentence "stories" with 80% accuracy. In addition she highlighted and wrote down key points in book she is reading: "The Alzheimer's Solution." Discussed adding 2-3 details for each key point she writes down to improve storage and retrieval of reading material.  Pt performed working memory & cognitive flexibility tasks given mod verbal cues for strategies with 80% accuracy.  Pt performed visual memory tasks given min to mod verbal cues for strategies with 100%  accuracy.       Assessment / Recommendations / Plan   Plan  Continue with current plan of care      Progression Toward Goals   Progression toward goals  Progressing toward goals       SLP Education - 05/14/19 1510    Education Details  re: practical application of compensatory strategies for short term recall    Person(s) Educated  Patient    Methods  Explanation;Demonstration;Verbal cues;Handout    Comprehension  Verbalized understanding;Need further instruction;Returned demonstration;Verbal cues required         SLP Long Term Goals - 04/06/19 1609      SLP LONG TERM GOAL #1   Title  Pt will use external memory aids & compensatory strategies to recall routine & personal information, and recent events with 80% accuracy.    Time  8    Period  Weeks    Status  On-going      SLP LONG TERM GOAL #2   Title  Pt will complete auditory and visual attention/vigilance/memory tasks with 80% accuracy given min cues.    Time  8    Period  Weeks    Status  On-going      SLP LONG TERM GOAL #3   Title  Pt will recall facts, answer wh-questions following reading short paragraphs/functional reading materials [employer], receipts, advertisement, bills, etc) with 80% accuracy.    Time  8    Period  Weeks    Status  On-going      SLP LONG TERM GOAL #4   Title  Pt will give at least 3 semantic descriptors when experiencing word finding difficulty with 100% accuracy.    Time  8    Period  Weeks    Status  On-going       Plan - 05/14/19 1512    Clinical Impression Statement  Pt demonstrated improved independence with visual attention tasks this session, but continues to require regular cues for use of strategies to aid short term recall of auditory information. Noted consistent completion of all home materials provided and commitment to cognitive training program. Pt will continue to benefit from skilled ST tx for cognitive training to restore and provide pt education re: compensatory  strategies to improve pt safety and functional independence.   Speech Therapy Frequency  2x / week    Duration  Other (comment)   8 weeks   Treatment/Interventions  SLP instruction and feedback;Cueing hierarchy;Cognitive reorganization;Compensatory strategies;Functional tasks;Patient/family education;Internal/external aids    Potential to Achieve Goals  Good    Potential Considerations  Family/community support;Previous level of function;Cooperation/participation level    SLP  Home Exercise Plan  convergent and divergent naming, sustained attention & short term recall tasks assigned    Consulted and Agree with Plan of Care  Patient       Patient will benefit from skilled therapeutic intervention in order to improve the following deficits and impairments:   Cognitive communication deficit    Problem List Patient Active Problem List   Diagnosis Date Noted  . Post laminectomy syndrome 09/09/2015    Rulon Eisenmenger, MA, CCC-SLP 05/14/2019, 3:23 PM  [place] MAIN [facility] SERVICES [address] [place], [place], [address] Phone: [phone]   Fax:  [phone]   Name: Erika Phillips MRN: [id number] Date of Birth: Sep 14, 1940

## 2019-05-18 ENCOUNTER — Ambulatory Visit: Payer: Medicare Other | Admitting: Speech Pathology

## 2019-05-18 ENCOUNTER — Encounter: Payer: Self-pay | Admitting: Speech Pathology

## 2019-05-18 ENCOUNTER — Other Ambulatory Visit: Payer: Self-pay

## 2019-05-18 DIAGNOSIS — R41841 Cognitive communication deficit: Secondary | ICD-10-CM | POA: Diagnosis not present

## 2019-05-18 NOTE — Therapy (Signed)
[place] MAIN [facility] SERVICES [address] [place], [place], [address] Phone: [phone]   Fax:  [phone]  Speech Language Pathology Progress Note/ Treatment Note  Patient Details  Name: Erika Phillips MRN: [id number] Date of Birth: 07-Nov-1940 No data recorded  Encounter Date: 05/18/2019  End of Session - 05/18/19 1223    Visit Number  10    Number of Visits  17    Date for SLP Re-Evaluation  05/27/19    Authorization - Visit Number  10    Authorization - Number of Visits  10    SLP Start Time  1100    SLP Stop Time   1154    SLP Time Calculation (min)  54 min    Activity Tolerance  Patient tolerated treatment well       Past Medical History:  Diagnosis Date  . Arachnoiditis   . Arthritis   . Foot fracture, right   . Gastritis   . GERD (gastroesophageal reflux disease)   . Hepatic hemangioma    pt denies  . Hyperlipidemia   . Hypertension   . Neuromuscular disorder (HCC)    numbness and tingling bilateral legs  . Reflux esophagitis     Past Surgical History:  Procedure Laterality Date  . CATARACT EXTRACTION     bilateral eyes  . COLONOSCOPY    . DENTAL SURGERY     crown put on  . ESOPHAGOGASTRODUODENOSCOPY    . ESOPHAGOGASTRODUODENOSCOPY (EGD) WITH PROPOFOL N/A 05/07/2016   Procedure: ESOPHAGOGASTRODUODENOSCOPY (EGD) WITH PROPOFOL;  Surgeon: Lollie Sails, MD;  Location: [facility] ENDOSCOPY;  Service: Endoscopy;  Laterality: N/A;  . LUMBAR LAMINECTOMY    . SPINAL CORD STIMULATOR INSERTION N/A 09/09/2015   Procedure: LUMBAR SPINAL CORD STIMULATOR INSERTION;  Surgeon: Erline Levine, MD;  Location: [place] NEURO ORS;  Service: Neurosurgery;  Laterality: N/A;  LUMBAR SPINAL CORD STIMULATOR INSERTION  . TUBAL LIGATION      There were no vitals filed for this visit.  Subjective Assessment - 05/18/19 1059    Subjective  Pt reported she received her 2nd dose of the vaccine and is doing well with no side effects other than a sore  arm.    Currently in Pain?  Yes    Pain Score  6     Pain Location  Back    Pain Type  Chronic pain    Pain Onset  More than a month ago    Pain Frequency  Constant            ADULT SLP TREATMENT - 05/18/19 0001      General Information   Behavior/Cognition  Alert;Pleasant mood;Cooperative    HPI  This very pleasant pt of [name] is here seeking assistance with increasing c/o of word finding. She stated she has noticed an increasing problem with finding her words when she's talking and an increased difficulty remembering names. She said she has also noticed changes in her memory since 2019. CT head without contrast on 11/28/2018 revealed no acute infarct, no mass or hemorrhage. Noted age related volume loss. Family notes she loses her train of thought during conversation.  No hx of head trauma or family hx of dementia. Pt denies issues with driving, managing finances. Stated she is able to cook and clean without mistakes. She has noticed some changes in her balance.      Pain Assessment   Pain Assessment  0-10    Pain Score  6  Pain Location  Back    Pain Intervention(s)  Monitored during session      Cognitive-Linquistic Treatment   Treatment focused on  Cognition;Patient/family/caregiver education    Skilled Treatment  Reviewed home assignments, pt highlighted and wrote down key points in rec'd book she is reading: "The Alzheimer's Solution." Discussed keeping a notebook and adding 2-3 details for each key point she writes down to improve storage and retrieval of reading material. Pt completed divided visual attention tasks with 100% accuracy given min to mod verbal cues. Pt performed working memory & cognitive flexibility tasks given mod verbal cues for strategies with 80% accuracy. Pt answered wh questions re: short 3-4 sentence "stories" with 90% accuracy given min to mod cues for repetition and highlighting key details.       Assessment / Recommendations / Plan   Plan   Continue with current plan of care      Progression Toward Goals   Progression toward goals  Progressing toward goals       SLP Education - 05/18/19 1221    Education Details  re: compensatory strategies for short term recall and specific instructions how to approach & complete HEP    Person(s) Educated  Patient    Methods  Demonstration;Explanation;Verbal cues    Comprehension  Need further instruction;Verbalized understanding;Returned demonstration         SLP Long Term Goals - 05/18/19 1224      SLP LONG TERM GOAL #1   Title  Pt will use external memory aids & compensatory strategies to recall routine & personal information, and recent events with 80% accuracy.    Time  8    Period  Weeks    Status  On-going    Target Date  06/24/19      SLP LONG TERM GOAL #2   Title  Pt will complete auditory and visual attention/vigilance/memory tasks with 80% accuracy given min cues.    Time  8    Period  Weeks    Target Date  06/24/19      SLP LONG TERM GOAL #3   Title  Pt will recall facts, answer wh-questions following reading short paragraphs/functional reading materials [employer], receipts, advertisement, bills, etc) with 80% accuracy.    Time  8    Period  Weeks    Target Date  06/24/19      SLP LONG TERM GOAL #4   Title  Pt will give at least 3 semantic descriptors when experiencing word finding difficulty with 100% accuracy.    Time  8    Period  Weeks    Status  On-going    Target Date  06/24/19       Plan - 05/18/19 1223    Clinical Impression Statement Pt demonstrated emerging independence with use of compensatory strategies of repetition and visualization to aid short term recall as seen in improved accuracy w/a decreased level of cueing. Noted improved sustained and divided attention throughout treatment. Pt is making steady progress on all treatment goals. Will submit re-certification for an additional 4 weeks of treatment to allow time to meet tx goals. Pt has missed  several sessions due to weather or schedule conflicts. Pt will continue to benefit from skilled ST tx for cognitive training to restore and provide pt education re: compensatory strategies to improve pt safety and functional independence.   Speech Therapy Frequency  2x / week    Duration  Other (comment)   8 weeks   Treatment/Interventions  SLP  instruction and feedback;Cueing hierarchy;Cognitive reorganization;Compensatory strategies;Functional tasks;Patient/family education;Internal/external aids    Potential to Achieve Goals  Good    Potential Considerations  Family/community support;Previous level of function;Cooperation/participation level    SLP Home Exercise Plan  convergent and divergent naming, sustained attention & short term recall tasks assigned    Consulted and Agree with Plan of Care  Patient       Patient will benefit from skilled therapeutic intervention in order to improve the following deficits and impairments:   Cognitive communication deficit    Problem List Patient Active Problem List   Diagnosis Date Noted  . Post laminectomy syndrome 09/09/2015    Rulon Eisenmenger, MA, CCC-SLP 05/18/2019, 12:48 PM  [place] MAIN [facility] SERVICES [address] [place], [place], [address] Phone: [phone]   Fax:  [phone]   Name: Erika Phillips MRN: [id number] Date of Birth: 14-Oct-1940

## 2019-05-21 ENCOUNTER — Ambulatory Visit: Payer: Medicare Other | Admitting: Speech Pathology

## 2019-05-25 ENCOUNTER — Other Ambulatory Visit: Payer: Self-pay

## 2019-05-25 ENCOUNTER — Encounter: Payer: Self-pay | Admitting: Speech Pathology

## 2019-05-25 ENCOUNTER — Ambulatory Visit: Payer: Medicare Other | Admitting: Speech Pathology

## 2019-05-25 DIAGNOSIS — R41841 Cognitive communication deficit: Secondary | ICD-10-CM

## 2019-05-25 NOTE — Therapy (Signed)
[place] MAIN [facility] [place] SERVICES [place], [place], [address] Phone: [phone]   Fax:  [phone]  Speech Language Pathology Treatment  Patient Details  Name: Erika Phillips MRN: [id number] Date of Birth: 05/23/1940 No data recorded  Encounter Date: 05/25/2019  End of Session - 05/25/19 1640    Visit Number  11    Number of Visits  17    Date for SLP Re-Evaluation  06/24/19    Authorization - Visit Number  1    Authorization - Number of Visits  10    SLP Start Time  1109    SLP Stop Time   1200    SLP Time Calculation (min)  51 min    Activity Tolerance  Patient tolerated treatment well       Past Medical History:  Diagnosis Date  . Arachnoiditis   . Arthritis   . Foot fracture, right   . Gastritis   . GERD (gastroesophageal reflux disease)   . Hepatic hemangioma    pt denies  . Hyperlipidemia   . Hypertension   . Neuromuscular disorder (HCC)    numbness and tingling bilateral legs  . Reflux esophagitis     Past Surgical History:  Procedure Laterality Date  . CATARACT EXTRACTION     bilateral eyes  . COLONOSCOPY    . DENTAL SURGERY     crown put on  . ESOPHAGOGASTRODUODENOSCOPY    . ESOPHAGOGASTRODUODENOSCOPY (EGD) WITH PROPOFOL N/A 05/07/2016   Procedure: ESOPHAGOGASTRODUODENOSCOPY (EGD) WITH PROPOFOL;  Surgeon: Lollie Sails, MD;  Location: [facility] ENDOSCOPY;  Service: Endoscopy;  Laterality: N/A;  . LUMBAR LAMINECTOMY    . SPINAL CORD STIMULATOR INSERTION N/A 09/09/2015   Procedure: LUMBAR SPINAL CORD STIMULATOR INSERTION;  Surgeon: Erline Levine, MD;  Location: [place] NEURO ORS;  Service: Neurosurgery;  Laterality: N/A;  LUMBAR SPINAL CORD STIMULATOR INSERTION  . TUBAL LIGATION      There were no vitals filed for this visit.  Subjective Assessment - 05/25/19 1116    Subjective  Pt reported she had a quiet weekend.    Currently in Pain?  Yes    Pain Score  7             ADULT SLP TREATMENT -  05/25/19 0001      General Information   Behavior/Cognition  Alert;Pleasant mood;Cooperative    HPI  This very pleasant pt of [name] is here seeking assistance with increasing c/o of word finding. She stated she has noticed an increasing problem with finding her words when she's talking and an increased difficulty remembering names. She said she has also noticed changes in her memory since 2019. CT head without contrast on 11/28/2018 revealed no acute infarct, no mass or hemorrhage. Noted age related volume loss. Family notes she loses her train of thought during conversation.  No hx of head trauma or family hx of dementia. Pt denies issues with driving, managing finances. Stated she is able to cook and clean without mistakes. She has noticed some changes in her balance.      Treatment Provided   Treatment provided  Cognitive-Linquistic      Pain Assessment   Pain Assessment  0-10    Pain Score  6     Pain Location  Back      Cognitive-Linquistic Treatment   Treatment focused on  Cognition;Patient/family/caregiver education    Skilled Treatment  Pt identified "memory peg" for short paragraphs and identified and wrote key  details with 100% accuracy given min to mod cues. Pt recalled salient details following completion of written notes with 80% given min to mod cues.  Pt used acronyms to recall lists of information with 75% accuracy given min cues.      Assessment / Recommendations / Plan   Plan  Continue with current plan of care      Progression Toward Goals   Progression toward goals  Progressing toward goals       SLP Education - 05/25/19 1640    Education Details  re: compensatory strategies for short term recall    Person(s) Educated  Patient    Methods  Explanation;Verbal cues;Handout    Comprehension  Verbalized understanding;Need further instruction;Returned demonstration;Verbal cues required         SLP Long Term Goals - 05/18/19 1224      SLP LONG TERM GOAL #1    Title  Pt will use external memory aids & compensatory strategies to recall routine & personal information, and recent events with 80% accuracy.    Time  8    Period  Weeks    Status  On-going    Target Date  06/24/19      SLP LONG TERM GOAL #2   Title  Pt will complete auditory and visual attention/vigilance/memory tasks with 80% accuracy given min cues.    Time  8    Period  Weeks    Target Date  06/24/19      SLP LONG TERM GOAL #3   Title  Pt will recall facts, answer wh-questions following reading short paragraphs/functional reading materials [employer], receipts, advertisement, bills, etc) with 80% accuracy.    Time  8    Period  Weeks    Target Date  06/24/19      SLP LONG TERM GOAL #4   Title  Pt will give at least 3 semantic descriptors when experiencing word finding difficulty with 100% accuracy.    Time  8    Period  Weeks    Status  On-going    Target Date  06/24/19       Plan - 05/25/19 1641    Clinical Impression Statement  Pt demonstrated improved accuracy with short term recall following memory exercises employing external strategies of "memory pegs" and acronyms as trials progressed. Pt appears dedicated to cognitive training, she has consistently brought her fully completed detailed homework to tx nearly every session. Pt will continue to benefit from skilled ST tx to promote restoration of cognitive linguistic skills and provide pt education re: compensatory strategies to improve functional independence.   Speech Therapy Frequency  2x / week    Duration  Other (comment)   8 weeks   Treatment/Interventions  SLP instruction and feedback;Cueing hierarchy;Cognitive reorganization;Compensatory strategies;Functional tasks;Patient/family education;Internal/external aids    Potential to Achieve Goals  Good    Potential Considerations  Family/community support;Previous level of function;Cooperation/participation level    SLP Home Exercise Plan  sustained attention & short  term recall tasks assigned    Consulted and Agree with Plan of Care  Patient       Patient will benefit from skilled therapeutic intervention in order to improve the following deficits and impairments:   Cognitive communication deficit    Problem List Patient Active Problem List   Diagnosis Date Noted  . Post laminectomy syndrome 09/09/2015    Ambera Fedele, MA, CCC-SLP 05/25/2019, 4:51 PM  [place] MAIN [facility] SERVICES [place], [place],  [place] Phone: [phone]   Fax:  [phone]   Name: ANISA MCLOUD MRN: [id number] Date of Birth: 24-Jun-1940

## 2019-05-28 ENCOUNTER — Ambulatory Visit: Payer: Medicare Other | Admitting: Speech Pathology

## 2019-06-11 ENCOUNTER — Other Ambulatory Visit: Payer: Self-pay

## 2019-06-11 ENCOUNTER — Ambulatory Visit: Payer: Medicare Other | Attending: Neurology | Admitting: Speech Pathology

## 2019-06-11 ENCOUNTER — Encounter: Payer: Self-pay | Admitting: Speech Pathology

## 2019-06-11 DIAGNOSIS — R41841 Cognitive communication deficit: Secondary | ICD-10-CM | POA: Insufficient documentation

## 2019-06-11 NOTE — Therapy (Signed)
[place] MAIN [facility] SERVICES [address] [place], [place], [address] Phone: [phone]   Fax:  [phone]  Speech Language Pathology Treatment  Patient Details  Name: Erika Phillips MRN: [id number] Date of Birth: 1940-05-08 No data recorded  Encounter Date: 06/11/2019  End of Session - 06/11/19 1312    Visit Number  12    Number of Visits  17    Date for SLP Re-Evaluation  06/24/19    Authorization - Visit Number  2    Authorization - Number of Visits  10    SLP Start Time  1108    SLP Stop Time   1208    SLP Time Calculation (min)  60 min    Activity Tolerance  Patient tolerated treatment well       Past Medical History:  Diagnosis Date  . Arachnoiditis   . Arthritis   . Foot fracture, right   . Gastritis   . GERD (gastroesophageal reflux disease)   . Hepatic hemangioma    pt denies  . Hyperlipidemia   . Hypertension   . Neuromuscular disorder (HCC)    numbness and tingling bilateral legs  . Reflux esophagitis     Past Surgical History:  Procedure Laterality Date  . CATARACT EXTRACTION     bilateral eyes  . COLONOSCOPY    . DENTAL SURGERY     crown put on  . ESOPHAGOGASTRODUODENOSCOPY    . ESOPHAGOGASTRODUODENOSCOPY (EGD) WITH PROPOFOL N/A 05/07/2016   Procedure: ESOPHAGOGASTRODUODENOSCOPY (EGD) WITH PROPOFOL;  Surgeon: Lollie Sails, MD;  Location: [facility] ENDOSCOPY;  Service: Endoscopy;  Laterality: N/A;  . LUMBAR LAMINECTOMY    . SPINAL CORD STIMULATOR INSERTION N/A 09/09/2015   Procedure: LUMBAR SPINAL CORD STIMULATOR INSERTION;  Surgeon: Erline Levine, MD;  Location: [place] NEURO ORS;  Service: Neurosurgery;  Laterality: N/A;  LUMBAR SPINAL CORD STIMULATOR INSERTION  . TUBAL LIGATION      There were no vitals filed for this visit.  Subjective Assessment - 06/11/19 1115    Subjective  Pt apologized for being late to tx, stating she had trouble getting around a very large puddle of water outside the hospital  entrance, and ended up getting her L foot completely wet.    Currently in Pain?  Yes    Pain Score  5     Pain Location  Back    Pain Descriptors / Indicators  Aching    Pain Type  Chronic pain    Pain Onset  More than a month ago    Pain Frequency  Constant    Pain Relieving Factors  nothing            ADULT SLP TREATMENT - 06/11/19 0001      General Information   Behavior/Cognition  Alert;Pleasant mood;Cooperative    HPI  This very pleasant pt of [name] is here seeking assistance with increasing c/o of word finding. She stated she has noticed an increasing problem with finding her words when she's talking and an increased difficulty remembering names. She said she has also noticed changes in her memory since 2019. CT head without contrast on 11/28/2018 revealed no acute infarct, no mass or hemorrhage. Noted age related volume loss. Family notes she loses her train of thought during conversation.  No hx of head trauma or family hx of dementia. Pt denies issues with driving, managing finances. Stated she is able to cook and clean without mistakes. She has noticed some changes in her  balance.      Pain Assessment   Pain Assessment  0-10    Pain Score  5     Pain Location  Back      Cognitive-Linquistic Treatment   Treatment focused on  Cognition;Patient/family/caregiver education    Skilled Treatment   Reviewed home assignments, pt answered wh questions re: short paragraphs with 90% accuracy. Pt highlighted and wrote down key points in book she is reading: "The Alzheimer's Solution." Discussed the 3 details she recorded for each key point she wrote down and educated pt on active lifestyle choices she can make to improve overall health & aid restoration of cognitive skills of attention and memory.  Pt named 4-5 semantic descriptors given pictured objects to enable SLP to guess pictured object given min verbal cues with 100% accuracy.        Assessment / Recommendations / Plan    Plan  Continue with current plan of care      Progression Toward Goals   Progression toward goals  Progressing toward goals       SLP Education - 06/11/19 1311    Education Details  re: compensatory strategies to aid short term recall, word finding difficulties, HEP recommendations    Person(s) Educated  Patient    Methods  Explanation;Demonstration;Handout;Verbal cues    Comprehension  Verbalized understanding;Need further instruction;Returned demonstration;Verbal cues required         SLP Long Term Goals - 05/18/19 1224      SLP LONG TERM GOAL #1   Title  Pt will use external memory aids & compensatory strategies to recall routine & personal information, and recent events with 80% accuracy.    Time  8    Period  Weeks    Status  On-going    Target Date  06/24/19      SLP LONG TERM GOAL #2   Title  Pt will complete auditory and visual attention/vigilance/memory tasks with 80% accuracy given min cues.    Time  8    Period  Weeks    Target Date  06/24/19      SLP LONG TERM GOAL #3   Title  Pt will recall facts, answer wh-questions following reading short paragraphs/functional reading materials [employer], receipts, advertisement, bills, etc) with 80% accuracy.    Time  8    Period  Weeks    Target Date  06/24/19      SLP LONG TERM GOAL #4   Title  Pt will give at least 3 semantic descriptors when experiencing word finding difficulty with 100% accuracy.    Time  8    Period  Weeks    Status  On-going    Target Date  06/24/19       Plan - 06/11/19 1312    Clinical Impression Statement  Noted marked improved independence with compensatory strategies to aid word finding this session. Pt reported her husband has remarked that he has noticed her word finding difficulties and memory appear to have improved since pt first began tx. Pt reported she has been empowered by the information she has learned in the rec'd text "The Alzheimer's Solution" and by all the strategies she is  learning to help improve her memory and word finding skills. Pt is making very steady progress on all tx goals and will likely d/c to home w/ consistent daily HEP practice in 1-2 sessions.    Speech Therapy Frequency  2x / week    Duration  Other (comment)  8 weeks   Treatment/Interventions  SLP instruction and feedback;Cueing hierarchy;Cognitive reorganization;Compensatory strategies;Functional tasks;Patient/family education;Internal/external aids    Potential to Achieve Goals  Good    Potential Considerations  Family/community support;Previous level of function;Cooperation/participation level    SLP Home Exercise Plan  sustained attention & short term recall tasks assigned    Consulted and Agree with Plan of Care  Patient       Patient will benefit from skilled therapeutic intervention in order to improve the following deficits and impairments:   Cognitive communication deficit    Problem List Patient Active Problem List   Diagnosis Date Noted  . Post laminectomy syndrome 09/09/2015    Rulon Eisenmenger, MA, CCC-SLP 06/11/2019, 1:30 PM  [place] MAIN [facility] SERVICES [address] [place], [place], [address] Phone: [phone]   Fax:  [phone]   Name: GRETE DELUDE MRN: [id number] Date of Birth: 05-06-1940

## 2019-06-18 ENCOUNTER — Ambulatory Visit: Payer: Medicare Other | Admitting: Speech Pathology

## 2019-06-22 ENCOUNTER — Other Ambulatory Visit: Payer: Self-pay

## 2019-06-22 ENCOUNTER — Encounter: Payer: Self-pay | Admitting: Speech Pathology

## 2019-06-22 ENCOUNTER — Ambulatory Visit: Payer: Medicare Other | Admitting: Speech Pathology

## 2019-06-22 DIAGNOSIS — R41841 Cognitive communication deficit: Secondary | ICD-10-CM

## 2019-06-22 NOTE — Therapy (Signed)
[place] MAIN [facility] SERVICES [place], [place], [address] Phone: [phone]   Fax:  [phone]  Speech Language Pathology Treatment  Patient Details  Name: Erika Phillips MRN: [id number] Date of Birth: 01-Jan-1941 No data recorded  Encounter Date: 06/22/2019  End of Session - 06/22/19 1540    Visit Number  13    Number of Visits  17    Date for SLP Re-Evaluation  06/24/19    Authorization - Visit Number  3    Authorization - Number of Visits  10    SLP Start Time  0737   pt arrived a few min late to tx   SLP Stop Time   1359    SLP Time Calculation (min)  52 min    Activity Tolerance  Patient tolerated treatment well       Past Medical History:  Diagnosis Date  . Arachnoiditis   . Arthritis   . Foot fracture, right   . Gastritis   . GERD (gastroesophageal reflux disease)   . Hepatic hemangioma    pt denies  . Hyperlipidemia   . Hypertension   . Neuromuscular disorder (HCC)    numbness and tingling bilateral legs  . Reflux esophagitis     Past Surgical History:  Procedure Laterality Date  . CATARACT EXTRACTION     bilateral eyes  . COLONOSCOPY    . DENTAL SURGERY     crown put on  . ESOPHAGOGASTRODUODENOSCOPY    . ESOPHAGOGASTRODUODENOSCOPY (EGD) WITH PROPOFOL N/A 05/07/2016   Procedure: ESOPHAGOGASTRODUODENOSCOPY (EGD) WITH PROPOFOL;  Surgeon: Lollie Sails, MD;  Location: [facility] ENDOSCOPY;  Service: Endoscopy;  Laterality: N/A;  . LUMBAR LAMINECTOMY    . SPINAL CORD STIMULATOR INSERTION N/A 09/09/2015   Procedure: LUMBAR SPINAL CORD STIMULATOR INSERTION;  Surgeon: Erline Levine, MD;  Location: [facility] NEURO ORS;  Service: Neurosurgery;  Laterality: N/A;  LUMBAR SPINAL CORD STIMULATOR INSERTION  . TUBAL LIGATION      There were no vitals filed for this visit.  Subjective Assessment - 06/22/19 1324    Subjective  Pt stated gratitude for all she has learned in ST tx to empower her to work on improving her  thinking, talking and memory. She reported consistent completion of her HEP.    Currently in Pain?  Yes    Pain Score  7     Pain Location  Back    Pain Descriptors / Indicators  Aching    Pain Type  Chronic pain    Pain Onset  More than a month ago    Pain Frequency  Constant    Pain Relieving Factors  nothing            ADULT SLP TREATMENT - 06/22/19 0001      General Information   Behavior/Cognition  Alert;Pleasant mood;Cooperative    HPI  This very pleasant pt of [name] is here seeking assistance with increasing c/o of word finding. She stated she has noticed an increasing problem with finding her words when she's talking and an increased difficulty remembering names. She said she has also noticed changes in her memory since 2019. CT head without contrast on 11/28/2018 revealed no acute infarct, no mass or hemorrhage. Noted age related volume loss. Family notes she loses her train of thought during conversation.  No hx of head trauma or family hx of dementia. Pt denies issues with driving, managing finances. Stated she is able to cook and clean without mistakes.  She has noticed some changes in her balance.      Treatment Provided   Treatment provided  Cognitive-Linquistic      Pain Assessment   Pain Assessment  0-10    Pain Score  7     Pain Location  Back      Cognitive-Linquistic Treatment   Treatment focused on  Cognition;Patient/family/caregiver education    Skilled Treatment   Pt named category members given named category with 80 % accuracy given min to mod verbal cues.  Patient performed working memory and cognitive flexibility tasks given min verbal cues with 90% accuracy.   Provided extensive materials for continued consistent home practice of working memory, short term recall and word finding skills. Educated pt re: practical ways to perform HEP consistently, and how to optimize use of materials provided.        Assessment / Recommendations / Plan   Plan   Continue with current plan of care      Progression Toward Goals   Progression toward goals  Progressing toward goals       SLP Education - 06/22/19 1539    Education Details  re: compensatory strategies to aid short term recall, word finding difficulties, HEP recommendations    Person(s) Educated  Patient    Methods  Explanation;Demonstration;Handout;Verbal cues    Comprehension  Verbalized understanding;Returned demonstration         SLP Long Term Goals - 06/22/19 1551      SLP LONG TERM GOAL #1   Title  Pt will use external memory aids & compensatory strategies to recall routine & personal information, and recent events with 80% accuracy.    Time  8    Period  Weeks    Status  Achieved      SLP LONG TERM GOAL #2   Title  Pt will complete auditory and visual attention/vigilance/memory tasks with 80% accuracy given min cues.    Time  8    Period  Weeks    Status  Achieved      SLP LONG TERM GOAL #3   Title  Pt will recall facts, answer wh-questions following reading short paragraphs/functional reading materials [employer], receipts, advertisement, bills, etc) with 80% accuracy.    Time  8    Period  Weeks    Status  Partially Met      SLP LONG TERM GOAL #4   Title  Pt will give at least 3 semantic descriptors when experiencing word finding difficulty with 100% accuracy.    Time  8    Period  Weeks    Status  Achieved       Plan - 06/22/19 1541    Clinical Impression Statement  Pt to d/c from ST tx today. She has met 3 out 4 tx goals, demonstrating improved word finding skills as well as independence using compensatory strategies to aid short term recall & working memory and word finding as needed. Pt reported she is committed to consistent practice of her HEP to maintain and continue to improve her cognitive linguistic skills.    Speech Therapy Frequency  2x / week    Duration  Other (comment)   8 weeks   Treatment/Interventions  SLP instruction and feedback;Cueing  hierarchy;Cognitive reorganization;Compensatory strategies;Functional tasks;Patient/family education;Internal/external aids    Potential to Achieve Goals  Good    Potential Considerations  Family/community support;Previous level of function;Cooperation/participation level    SLP Home Exercise Plan  sustained attention & short term recall tasks assigned  Consulted and Agree with Plan of Care  Patient       Patient will benefit from skilled therapeutic intervention in order to improve the following deficits and impairments:   Cognitive communication deficit    Problem List Patient Active Problem List   Diagnosis Date Noted  . Post laminectomy syndrome 09/09/2015    Rulon Eisenmenger, MA, CCC-SLP 06/22/2019, 3:52 PM  [place] MAIN [facility] SERVICES [address] [place], [place], [address] Phone: [phone]   Fax:  [phone]   Name: Erika Phillips MRN: [id number] Date of Birth: [birth year]

## 2019-08-19 ENCOUNTER — Other Ambulatory Visit
Admission: RE | Admit: 2019-08-19 | Discharge: 2019-08-19 | Disposition: A | Discharge status: Home / Self Care | Payer: Medicare Other | Source: Ambulatory Visit | Attending: General Surgery | Admitting: General Surgery

## 2019-08-19 DIAGNOSIS — Z20822 Contact with and (suspected) exposure to covid-19: Secondary | ICD-10-CM | POA: Diagnosis not present

## 2019-08-19 DIAGNOSIS — Z01812 Encounter for preprocedural laboratory examination: Secondary | ICD-10-CM | POA: Insufficient documentation

## 2019-08-19 LAB — SARS CORONAVIRUS 2 (TAT 6-24 HRS): SARS Coronavirus 2: NEGATIVE

## 2019-08-20 ENCOUNTER — Encounter: Payer: Self-pay | Admitting: General Surgery

## 2019-08-21 ENCOUNTER — Ambulatory Visit: Payer: Medicare Other | Admitting: Anesthesiology

## 2019-08-21 ENCOUNTER — Encounter: Payer: Self-pay | Admitting: General Surgery

## 2019-08-21 ENCOUNTER — Ambulatory Visit
Admission: RE | Admit: 2019-08-21 | Discharge: 2019-08-21 | Disposition: A | Discharge status: Home / Self Care | Payer: Medicare Other | Attending: General Surgery | Admitting: General Surgery

## 2019-08-21 ENCOUNTER — Encounter
Admission: RE | Disposition: A | Discharge status: Home / Self Care | Payer: Self-pay | Source: Home / Self Care | Attending: General Surgery

## 2019-08-21 ENCOUNTER — Other Ambulatory Visit: Payer: Self-pay

## 2019-08-21 DIAGNOSIS — K2100 Gastro-esophageal reflux disease with esophagitis, without bleeding: Secondary | ICD-10-CM | POA: Diagnosis not present

## 2019-08-21 DIAGNOSIS — Z87891 Personal history of nicotine dependence: Secondary | ICD-10-CM | POA: Insufficient documentation

## 2019-08-21 DIAGNOSIS — Z79899 Other long term (current) drug therapy: Secondary | ICD-10-CM | POA: Insufficient documentation

## 2019-08-21 DIAGNOSIS — Z1211 Encounter for screening for malignant neoplasm of colon: Secondary | ICD-10-CM | POA: Diagnosis present

## 2019-08-21 DIAGNOSIS — I10 Essential (primary) hypertension: Secondary | ICD-10-CM | POA: Diagnosis not present

## 2019-08-21 DIAGNOSIS — E785 Hyperlipidemia, unspecified: Secondary | ICD-10-CM | POA: Insufficient documentation

## 2019-08-21 DIAGNOSIS — M1990 Unspecified osteoarthritis, unspecified site: Secondary | ICD-10-CM | POA: Diagnosis not present

## 2019-08-21 HISTORY — DX: Dyskinesia of esophagus: K22.4

## 2019-08-21 HISTORY — DX: Mild cognitive impairment of uncertain or unknown etiology: G31.84

## 2019-08-21 HISTORY — PX: COLONOSCOPY WITH PROPOFOL: SHX5780

## 2019-08-21 SURGERY — COLONOSCOPY WITH PROPOFOL
Anesthesia: General

## 2019-08-21 MED ORDER — SODIUM CHLORIDE 0.9 % IV SOLN: INTRAVENOUS | Status: DC

## 2019-08-21 MED ORDER — LIDOCAINE HCL (CARDIAC) PF 100 MG/5ML IV SOSY
PREFILLED_SYRINGE | INTRAVENOUS | Status: DC | PRN
  Administered 2019-08-21: 80 mg via INTRAVENOUS

## 2019-08-21 MED ORDER — PROPOFOL 500 MG/50ML IV EMUL
INTRAVENOUS | Status: DC | PRN
  Administered 2019-08-21: 120 ug/kg/min via INTRAVENOUS

## 2019-08-21 MED ORDER — PROPOFOL 10 MG/ML IV BOLUS
INTRAVENOUS | Status: DC | PRN
  Administered 2019-08-21: 80 mg via INTRAVENOUS

## 2019-08-21 NOTE — Op Note (Addendum)
[facility] Gastroenterology Patient Name: Erika Phillips Procedure Date: 08/21/2019 9:06 AM MRN: [id number] Account #: [id number] Date of Birth: 1941-02-26 Admit Type: Outpatient Age: 79 Room: [facility] ENDO ROOM 1 Gender: Female Note Status: Finalized Procedure:             Colonoscopy Indications:           Screening for colorectal malignant neoplasm Providers:             Robert Bellow, MD Medicines:             Monitored Anesthesia Care Complications:         No immediate complications. Procedure:             Pre-Anesthesia Assessment:                        - Prior to the procedure, a History and Physical was                         performed, and patient medications, allergies and                         sensitivities were reviewed. The patient's tolerance                         of previous anesthesia was reviewed.                        - The risks and benefits of the procedure and the                         sedation options and risks were discussed with the                         patient. All questions were answered and informed                         consent was obtained.                        After obtaining informed consent, the colonoscope was                         passed under direct vision. Throughout the procedure,                         the patient's blood pressure, pulse, and oxygen                         saturations were monitored continuously. The was                         introduced through the anus and advanced to the the                         cecum, identified by appendiceal orifice and ileocecal                         valve. The colonoscopy was performed without  difficulty. The patient tolerated the procedure well.                         The quality of the bowel preparation was excellent. Findings:      The entire examined colon appeared normal on direct and retroflexion       views. Impression:             - The entire examined colon is normal on direct and                         retroflexion views.                        - No specimens collected. Recommendation:        - Discharge patient to home (via wheelchair). Procedure Code(s):     --- Professional ---                        [phone], Colonoscopy, flexible; diagnostic, including                         collection of specimen(s) by brushing or washing, when                         performed (separate procedure) Diagnosis Code(s):     --- Professional ---                        Z12.11, Encounter for screening for malignant neoplasm                         of colon CPT copyright 2019 [employer]. All rights reserved. The codes documented in this report are preliminary and upon coder review may  be revised to meet current compliance requirements. Robert Bellow, MD 08/21/2019 9:40:57 AM This report has been signed electronically. Number of Addenda: 0 Note Initiated On: 08/21/2019 9:06 AM Scope Withdrawal Time: 0 hours 8 minutes 46 seconds  Total Procedure Duration: 0 hours 16 minutes 19 seconds       [facility]

## 2019-08-21 NOTE — Anesthesia Preprocedure Evaluation (Addendum)
Anesthesia Evaluation  Patient identified by MRN, date of birth, ID band Patient awake    Reviewed: Allergy & Precautions, H&P , NPO status , reviewed documented beta blocker date and time   Airway Mallampati: II  TM Distance: >3 FB Neck ROM: limited    Dental  (+) Teeth Intact   Pulmonary former smoker,    Pulmonary exam normal        Cardiovascular hypertension, Normal cardiovascular exam     Neuro/Psych  Neuromuscular disease    GI/Hepatic GERD  Controlled,  Endo/Other    Renal/GU      Musculoskeletal  (+) Arthritis ,   Abdominal   Peds  Hematology   Anesthesia Other Findings Past Medical History: No date: Arachnoiditis No date: Arthritis No date: Esophageal motility disorder No date: Foot fracture, right No date: Gastritis No date: GERD (gastroesophageal reflux disease) No date: Hepatic hemangioma     Comment:  pt denies No date: Hyperlipidemia No date: Hypertension No date: MCI (mild cognitive impairment) No date: Neuromuscular disorder (HCC)     Comment:  numbness and tingling bilateral legs No date: Reflux esophagitis  Past Surgical History: No date: CATARACT EXTRACTION     Comment:  bilateral eyes No date: COLONOSCOPY No date: DENTAL SURGERY     Comment:  crown put on No date: ESOPHAGOGASTRODUODENOSCOPY 05/07/2016: ESOPHAGOGASTRODUODENOSCOPY (EGD) WITH PROPOFOL; N/A     Comment:  Procedure: ESOPHAGOGASTRODUODENOSCOPY (EGD) WITH               PROPOFOL;  Surgeon: Lollie Sails, MD;  Location:               [facility] ENDOSCOPY;  Service: Endoscopy;  Laterality: N/A; No date: LUMBAR LAMINECTOMY 09/09/2015: SPINAL CORD STIMULATOR INSERTION; N/A     Comment:  Procedure: LUMBAR SPINAL CORD STIMULATOR INSERTION;                Surgeon: Erline Levine, MD;  Location: [place] NEURO ORS;                Service: Neurosurgery;  Laterality: N/A;  LUMBAR SPINAL               CORD STIMULATOR INSERTION No date: TUBAL  LIGATION     Reproductive/Obstetrics                            Anesthesia Physical Anesthesia Plan  ASA: II  Anesthesia Plan: General   Post-op Pain Management:    Induction: Intravenous  PONV Risk Score and Plan: Treatment may vary due to age or medical condition and TIVA  Airway Management Planned: Nasal Cannula and Natural Airway  Additional Equipment:   Intra-op Plan:   Post-operative Plan:   Informed Consent: I have reviewed the patients History and Physical, chart, labs and discussed the procedure including the risks, benefits and alternatives for the proposed anesthesia with the patient or authorized representative who has indicated his/her understanding and acceptance.     Dental Advisory Given  Plan Discussed with: CRNA  Anesthesia Plan Comments:         Anesthesia Quick Evaluation

## 2019-08-21 NOTE — Anesthesia Postprocedure Evaluation (Signed)
Anesthesia Post Note  Patient: Jaquesha A Beazley  Procedure(s) Performed: COLONOSCOPY WITH PROPOFOL (N/A )  Patient location during evaluation: Endoscopy Anesthesia Type: General Level of consciousness: awake and alert Pain management: pain level controlled Vital Signs Assessment: post-procedure vital signs reviewed and stable Respiratory status: spontaneous breathing, nonlabored ventilation and respiratory function stable Cardiovascular status: blood pressure returned to baseline and stable Postop Assessment: no apparent nausea or vomiting Anesthetic complications: no     Last Vitals:  Vitals:   08/21/19 0845 08/21/19 0947  BP: 140/83 122/80  Pulse: 67 66  Resp: 16 12  Temp: (!) 35.9 C 36.6 C  SpO2: 99% 100%    Last Pain:  Vitals:   08/21/19 1007  TempSrc:   PainSc: 0-No pain                 Alphonsus Sias

## 2019-08-21 NOTE — H&P (Signed)
Erika Phillips [id number] 1940/05/06     HPI:  Healthy 79 y/o for screening colonoscopy (last study at age 57). Tolerated prep well.   Medications Prior to Admission  Medication Sig Dispense Refill Last Dose  . azelastine (ASTELIN) 0.1 % nasal spray Place 1 spray into both nostrils daily as needed for rhinitis. Use in each nostril as directed   08/20/2019 at 1900  . hydrochlorothiazide (HYDRODIURIL) 25 MG tablet Take 25 mg by mouth daily.   08/20/2019 at 0800  . HYDROcodone-acetaminophen (NORCO) 10-325 MG tablet Take 1 tablet by mouth every 4 (four) hours as needed for moderate pain.   08/21/2019 at 0600  . hydroxypropyl methylcellulose / hypromellose (ISOPTO TEARS / GONIOVISC) 2.5 % ophthalmic solution 1 drop.   08/20/2019 at 0800  . lisinopril (PRINIVIL,ZESTRIL) 10 MG tablet Take 10 mg by mouth daily.   08/20/2019 at 0800  . methadone (DOLOPHINE) 10 MG tablet Take 10 mg by mouth every 8 (eight) hours.   08/21/2019 at 0600  . sucralfate (CARAFATE) 1 g tablet Take 1 g by mouth daily.   Past Week at Unknown time  . dexlansoprazole (DEXILANT) 60 MG capsule Take 60 mg by mouth daily.   Not Taking at Unknown time  . doxepin (SINEQUAN) 10 MG capsule Take 10 mg by mouth at bedtime as needed. 1-2 capsules at bedtime for sleep   Completed Course at Unknown time   No Known Allergies Past Medical History:  Diagnosis Date  . Arachnoiditis   . Arthritis   . Esophageal motility disorder   . Foot fracture, right   . Gastritis   . GERD (gastroesophageal reflux disease)   . Hepatic hemangioma    pt denies  . Hyperlipidemia   . Hypertension   . MCI (mild cognitive impairment)   . Neuromuscular disorder (HCC)    numbness and tingling bilateral legs  . Reflux esophagitis    Past Surgical History:  Procedure Laterality Date  . CATARACT EXTRACTION     bilateral eyes  . COLONOSCOPY    . DENTAL SURGERY     crown put on  . ESOPHAGOGASTRODUODENOSCOPY    . ESOPHAGOGASTRODUODENOSCOPY (EGD) WITH PROPOFOL  N/A 05/07/2016   Procedure: ESOPHAGOGASTRODUODENOSCOPY (EGD) WITH PROPOFOL;  Surgeon: Erika Sails, MD;  Location: [facility] ENDOSCOPY;  Service: Endoscopy;  Laterality: N/A;  . LUMBAR LAMINECTOMY    . SPINAL CORD STIMULATOR INSERTION N/A 09/09/2015   Procedure: LUMBAR SPINAL CORD STIMULATOR INSERTION;  Surgeon: Erika Levine, MD;  Location: [place] NEURO ORS;  Service: Neurosurgery;  Laterality: N/A;  LUMBAR SPINAL CORD STIMULATOR INSERTION  . TUBAL LIGATION     Social History   Socioeconomic History  . Marital status: Married    Spouse name: Not on file  . Number of children: Not on file  . Years of education: Not on file  . Highest education level: Not on file  Occupational History  . Not on file  Tobacco Use  . Smoking status: Former Research scientist (life sciences)  . Smokeless tobacco: Never Used  . Tobacco comment: stopped smoking 40 years ago  Substance and Sexual Activity  . Alcohol use: Yes    Comment: 1 glass of wine a day  . Drug use: No  . Sexual activity: Not on file  Other Topics Concern  . Not on file  Social History Narrative  . Not on file   Social Determinants of Health   Financial Resource Strain:   . Difficulty of Paying Living Expenses:   Food Insecurity:   .  Worried About Charity fundraiser in the Last Year:   . Arboriculturist in the Last Year:   Transportation Needs:   . Film/video editor (Medical):   Marland Kitchen Lack of Transportation (Non-Medical):   Physical Activity:   . Days of Exercise per Week:   . Minutes of Exercise per Session:   Stress:   . Feeling of Stress :   Social Connections:   . Frequency of Communication with Friends and Family:   . Frequency of Social Gatherings with Friends and Family:   . Attends Religious Services:   . Active Member of Clubs or Organizations:   . Attends Archivist Meetings:   Marland Kitchen Marital Status:   Intimate Partner Violence:   . Fear of Current or Ex-Partner:   . Emotionally Abused:   Marland Kitchen Physically Abused:   . Sexually Abused:     Social History   Social History Narrative  . Not on file     ROS: Negative.     PE: HEENT: Negative. Lungs: Clear. Cardio: RR.  Assessment/Plan:  Proceed with planned endoscopy. Erika Phillips [facility] 08/21/2019

## 2019-08-21 NOTE — Transfer of Care (Signed)
Immediate Anesthesia Transfer of Care Note  Patient: Erika Phillips  Procedure(s) Performed: COLONOSCOPY WITH PROPOFOL (N/A )  Patient Location: PACU and Endoscopy Unit  Anesthesia Type:General  Level of Consciousness: awake  Airway & Oxygen Therapy: Patient Spontanous Breathing  Post-op Assessment: Report given to RN  Post vital signs: stable  Last Vitals:  Vitals Value Taken Time  BP 122/80 08/21/19 0947  Temp 36.6 C 08/21/19 0947  Pulse 65 08/21/19 0948  Resp 14 08/21/19 0948  SpO2 100 % 08/21/19 0948  Vitals shown include unvalidated device data.  Last Pain:  Vitals:   08/21/19 0947  TempSrc: Temporal  PainSc: 0-No pain         Complications: No apparent anesthesia complications

## 2019-10-27 ENCOUNTER — Other Ambulatory Visit: Payer: Self-pay | Admitting: Internal Medicine

## 2019-10-27 DIAGNOSIS — Z1231 Encounter for screening mammogram for malignant neoplasm of breast: Secondary | ICD-10-CM

## 2019-11-03 ENCOUNTER — Ambulatory Visit
Admission: RE | Admit: 2019-11-03 | Discharge: 2019-11-03 | Disposition: A | Discharge status: Home / Self Care | Payer: Medicare Other | Source: Ambulatory Visit | Attending: Internal Medicine | Admitting: Internal Medicine

## 2019-11-03 ENCOUNTER — Other Ambulatory Visit: Payer: Self-pay

## 2019-11-03 DIAGNOSIS — Z1231 Encounter for screening mammogram for malignant neoplasm of breast: Secondary | ICD-10-CM | POA: Insufficient documentation

## 2020-05-19 DIAGNOSIS — E7849 Other hyperlipidemia: Secondary | ICD-10-CM | POA: Diagnosis not present

## 2020-05-19 DIAGNOSIS — I10 Essential (primary) hypertension: Secondary | ICD-10-CM | POA: Diagnosis not present

## 2020-05-19 DIAGNOSIS — N1831 Chronic kidney disease, stage 3a: Secondary | ICD-10-CM | POA: Diagnosis not present

## 2020-05-19 DIAGNOSIS — K219 Gastro-esophageal reflux disease without esophagitis: Secondary | ICD-10-CM | POA: Diagnosis not present

## 2020-05-26 DIAGNOSIS — G039 Meningitis, unspecified: Secondary | ICD-10-CM | POA: Diagnosis not present

## 2020-05-26 DIAGNOSIS — M1990 Unspecified osteoarthritis, unspecified site: Secondary | ICD-10-CM | POA: Diagnosis not present

## 2020-05-26 DIAGNOSIS — N1831 Chronic kidney disease, stage 3a: Secondary | ICD-10-CM | POA: Diagnosis not present

## 2020-05-26 DIAGNOSIS — K219 Gastro-esophageal reflux disease without esophagitis: Secondary | ICD-10-CM | POA: Diagnosis not present

## 2020-05-26 DIAGNOSIS — Z0000 Encounter for general adult medical examination without abnormal findings: Secondary | ICD-10-CM | POA: Diagnosis not present

## 2020-05-26 DIAGNOSIS — I129 Hypertensive chronic kidney disease with stage 1 through stage 4 chronic kidney disease, or unspecified chronic kidney disease: Secondary | ICD-10-CM | POA: Diagnosis not present

## 2020-05-26 DIAGNOSIS — N2581 Secondary hyperparathyroidism of renal origin: Secondary | ICD-10-CM | POA: Diagnosis not present

## 2020-05-26 DIAGNOSIS — G3184 Mild cognitive impairment, so stated: Secondary | ICD-10-CM | POA: Diagnosis not present

## 2020-05-26 DIAGNOSIS — E7849 Other hyperlipidemia: Secondary | ICD-10-CM | POA: Diagnosis not present

## 2020-07-26 DIAGNOSIS — F5104 Psychophysiologic insomnia: Secondary | ICD-10-CM | POA: Diagnosis not present

## 2020-07-26 DIAGNOSIS — G3184 Mild cognitive impairment, so stated: Secondary | ICD-10-CM | POA: Diagnosis not present

## 2020-12-13 DIAGNOSIS — G3184 Mild cognitive impairment, so stated: Secondary | ICD-10-CM | POA: Diagnosis not present

## 2020-12-13 DIAGNOSIS — M1990 Unspecified osteoarthritis, unspecified site: Secondary | ICD-10-CM | POA: Diagnosis not present

## 2020-12-13 DIAGNOSIS — K219 Gastro-esophageal reflux disease without esophagitis: Secondary | ICD-10-CM | POA: Diagnosis not present

## 2020-12-13 DIAGNOSIS — E7849 Other hyperlipidemia: Secondary | ICD-10-CM | POA: Diagnosis not present

## 2020-12-13 DIAGNOSIS — N2581 Secondary hyperparathyroidism of renal origin: Secondary | ICD-10-CM | POA: Diagnosis not present

## 2020-12-13 DIAGNOSIS — I10 Essential (primary) hypertension: Secondary | ICD-10-CM | POA: Diagnosis not present

## 2020-12-20 ENCOUNTER — Other Ambulatory Visit: Payer: Self-pay | Admitting: Internal Medicine

## 2020-12-20 DIAGNOSIS — R4701 Aphasia: Secondary | ICD-10-CM | POA: Diagnosis not present

## 2020-12-20 DIAGNOSIS — I10 Essential (primary) hypertension: Secondary | ICD-10-CM

## 2020-12-20 DIAGNOSIS — I129 Hypertensive chronic kidney disease with stage 1 through stage 4 chronic kidney disease, or unspecified chronic kidney disease: Secondary | ICD-10-CM | POA: Diagnosis not present

## 2020-12-20 DIAGNOSIS — R3129 Other microscopic hematuria: Secondary | ICD-10-CM | POA: Diagnosis not present

## 2020-12-20 DIAGNOSIS — E785 Hyperlipidemia, unspecified: Secondary | ICD-10-CM | POA: Diagnosis not present

## 2020-12-20 DIAGNOSIS — G3184 Mild cognitive impairment, so stated: Secondary | ICD-10-CM | POA: Diagnosis not present

## 2020-12-20 DIAGNOSIS — K219 Gastro-esophageal reflux disease without esophagitis: Secondary | ICD-10-CM | POA: Diagnosis not present

## 2020-12-20 DIAGNOSIS — E538 Deficiency of other specified B group vitamins: Secondary | ICD-10-CM | POA: Diagnosis not present

## 2020-12-20 DIAGNOSIS — Z79891 Long term (current) use of opiate analgesic: Secondary | ICD-10-CM | POA: Diagnosis not present

## 2020-12-20 DIAGNOSIS — N1831 Chronic kidney disease, stage 3a: Secondary | ICD-10-CM | POA: Diagnosis not present

## 2020-12-28 DIAGNOSIS — N3000 Acute cystitis without hematuria: Secondary | ICD-10-CM | POA: Diagnosis not present

## 2020-12-28 DIAGNOSIS — R911 Solitary pulmonary nodule: Secondary | ICD-10-CM | POA: Diagnosis not present

## 2020-12-28 DIAGNOSIS — R109 Unspecified abdominal pain: Secondary | ICD-10-CM | POA: Diagnosis not present

## 2020-12-28 DIAGNOSIS — E876 Hypokalemia: Secondary | ICD-10-CM | POA: Diagnosis not present

## 2020-12-28 DIAGNOSIS — R1012 Left upper quadrant pain: Secondary | ICD-10-CM | POA: Diagnosis not present

## 2020-12-28 DIAGNOSIS — R079 Chest pain, unspecified: Secondary | ICD-10-CM | POA: Diagnosis not present

## 2020-12-28 DIAGNOSIS — R4781 Slurred speech: Secondary | ICD-10-CM | POA: Diagnosis not present

## 2020-12-28 DIAGNOSIS — R197 Diarrhea, unspecified: Secondary | ICD-10-CM | POA: Diagnosis not present

## 2020-12-28 DIAGNOSIS — R410 Disorientation, unspecified: Secondary | ICD-10-CM | POA: Diagnosis not present

## 2020-12-28 DIAGNOSIS — I493 Ventricular premature depolarization: Secondary | ICD-10-CM | POA: Diagnosis not present

## 2020-12-28 DIAGNOSIS — R634 Abnormal weight loss: Secondary | ICD-10-CM | POA: Diagnosis not present

## 2020-12-28 DIAGNOSIS — R112 Nausea with vomiting, unspecified: Secondary | ICD-10-CM | POA: Diagnosis not present

## 2020-12-28 DIAGNOSIS — K7689 Other specified diseases of liver: Secondary | ICD-10-CM | POA: Diagnosis not present

## 2020-12-28 DIAGNOSIS — R9431 Abnormal electrocardiogram [ECG] [EKG]: Secondary | ICD-10-CM | POA: Diagnosis not present

## 2020-12-28 DIAGNOSIS — R9341 Abnormal radiologic findings on diagnostic imaging of renal pelvis, ureter, or bladder: Secondary | ICD-10-CM | POA: Diagnosis not present

## 2020-12-28 DIAGNOSIS — R933 Abnormal findings on diagnostic imaging of other parts of digestive tract: Secondary | ICD-10-CM | POA: Diagnosis not present

## 2020-12-29 DIAGNOSIS — K7689 Other specified diseases of liver: Secondary | ICD-10-CM | POA: Diagnosis not present

## 2020-12-29 DIAGNOSIS — R410 Disorientation, unspecified: Secondary | ICD-10-CM | POA: Diagnosis not present

## 2020-12-29 DIAGNOSIS — R109 Unspecified abdominal pain: Secondary | ICD-10-CM | POA: Diagnosis not present

## 2020-12-29 DIAGNOSIS — R4781 Slurred speech: Secondary | ICD-10-CM | POA: Diagnosis not present

## 2020-12-29 DIAGNOSIS — R9341 Abnormal radiologic findings on diagnostic imaging of renal pelvis, ureter, or bladder: Secondary | ICD-10-CM | POA: Diagnosis not present

## 2020-12-29 DIAGNOSIS — R933 Abnormal findings on diagnostic imaging of other parts of digestive tract: Secondary | ICD-10-CM | POA: Diagnosis not present

## 2020-12-30 DIAGNOSIS — N3000 Acute cystitis without hematuria: Secondary | ICD-10-CM | POA: Diagnosis not present

## 2021-04-28 ENCOUNTER — Telehealth: Payer: Self-pay | Admitting: Primary Care

## 2021-04-28 NOTE — Telephone Encounter (Signed)
Attempted to contact patient to schedule Palliative Consult, no answer - left message with reason for call along with my name and call back number.  I called and spoke with patient's husband Herbie Baltimore, regarding the Palliative referral/services and he and wife were both in agreement with scheduling visit.  I have scheduled an In-home Consult for 1/31/223 @ 12:30 PM.

## 2021-05-08 ENCOUNTER — Telehealth: Payer: Self-pay | Admitting: Primary Care

## 2021-05-08 NOTE — Telephone Encounter (Signed)
Call to remind re appt tomorrow, husband stated he is having surgery and wishes to do the visit after that is over. Scheduling notified.

## 2021-05-09 ENCOUNTER — Other Ambulatory Visit: Payer: Self-pay | Admitting: Primary Care

## 2021-05-25 ENCOUNTER — Telehealth: Payer: Self-pay

## 2021-05-25 NOTE — Telephone Encounter (Signed)
Spoke with patient's spouse Herbie Baltimore. He reports patient has moved to [place] with her children due to worsening dementia. Will cancel Palliative referral and notify referring provider.

## 2021-07-08 DEATH — deceased
